# Patient Record
Sex: Male | Born: 2000 | Race: Black or African American | Hispanic: No | Marital: Single | Smoking: Never smoker
Health system: Southern US, Community
[De-identification: ages and names within clinical notes are randomized; demographics above are authoritative.]

## PROBLEM LIST (undated history)

## (undated) DIAGNOSIS — F930 Separation anxiety disorder of childhood: Secondary | ICD-10-CM

## (undated) DIAGNOSIS — F909 Attention-deficit hyperactivity disorder, unspecified type: Secondary | ICD-10-CM

## (undated) DIAGNOSIS — F32A Depression, unspecified: Secondary | ICD-10-CM

## (undated) DIAGNOSIS — F840 Autistic disorder: Secondary | ICD-10-CM

## (undated) DIAGNOSIS — F429 Obsessive-compulsive disorder, unspecified: Secondary | ICD-10-CM

## (undated) DIAGNOSIS — I10 Essential (primary) hypertension: Secondary | ICD-10-CM

## (undated) DIAGNOSIS — R569 Unspecified convulsions: Secondary | ICD-10-CM

## (undated) HISTORY — PX: TONSILLECTOMY: SUR1361

---
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
## 2019-01-22 ENCOUNTER — Telehealth: Payer: Self-pay | Admitting: Neurology

## 2019-01-22 MED ORDER — LEVETIRACETAM 500 MG PO TABS: ORAL_TABLET | ORAL | 3 refills | Status: DC

## 2019-01-22 NOTE — Telephone Encounter (Signed)
Pt's mom returned call but she was aware that I needed to speak with pt. Spoke with pt and informed him that rx was waiting at the pharmacy. He had no additional questions at this time. Nothing further is needed

## 2019-01-22 NOTE — Telephone Encounter (Signed)
According to phone note on 12/25/18 Dr. Carles Collet changed pt''s rx for his keppra. Sent in a new rx to pharmacy. Attempted to reach pt as there is no DPR given permisson to speak to mother. No answer, left vm to call back

## 2019-01-22 NOTE — Telephone Encounter (Signed)
Patients mother called in needing to have a new Rx for this medication with the new instructions sent to [facility] on [address] in [place].  Thank you

## 2019-02-22 ENCOUNTER — Telehealth: Payer: Self-pay | Admitting: Neurology

## 2019-02-22 MED ORDER — LEVETIRACETAM 1000 MG PO TABS: 1000.0000 mg | ORAL_TABLET | Freq: Two times a day (BID) | ORAL | 11 refills | Status: DC

## 2019-02-22 NOTE — Telephone Encounter (Signed)
Mother is calling in that patient had a grand mal seizure on Saturday morning in his sleep. He said he was telling the dog to be quiet but mother said his mouth was not moving when she came in the room with dog. He chewed on his bottom lip really bad. He has been feeling fine but not he is light headed. This just started a couple of hours ago. Thanks!

## 2019-02-22 NOTE — Telephone Encounter (Signed)
Spoke to his mother, she heard their dog barking really loudly, when she got to the room, she kept calling him and he was not responding initially and had bitten his lip. No missed meds. Felt funny the day after the seizure and started throwing up, then felt better after. Complaining of headache when he bends over. We discussed increasing Keppra to 1000mg  BID. Proceed with EEG as previously discussed. He does not drive. F/u as scheduled

## 2019-02-24 ENCOUNTER — Other Ambulatory Visit: Payer: Self-pay

## 2019-02-24 ENCOUNTER — Ambulatory Visit (INDEPENDENT_AMBULATORY_CARE_PROVIDER_SITE_OTHER): Payer: Medicaid Other | Admitting: Neurology

## 2019-02-24 DIAGNOSIS — G40309 Generalized idiopathic epilepsy and epileptic syndromes, not intractable, without status epilepticus: Secondary | ICD-10-CM | POA: Diagnosis not present

## 2019-03-02 NOTE — Procedures (Signed)
ELECTROENCEPHALOGRAM REPORT  Date of Study: 02/24/2019  Patient's Name: Joshua Sanchez MRN: [id number] Date of Birth: 07-21-2000  Referring Provider: Dr. Patrcia Dolly  Clinical History: This is a 19 year old man with recurrent seizures, most recently on 02/20/2019. EEG for classification.  Medications: KEPPRA 500 MG tablet STRATTERA 25 MG capsule PROZAC 20 MG capsule INTUNIV) 2 MG TB24 ER tablet ATARAX/VISTARIL 25 MG tablet ADVIL 200 MG tablet CLARITIN 10 MG tablet Melatonin 3 MG CAPS  Technical Summary: A multichannel digital 1-hour sleep-deprived EEG recording measured by the international 10-20 system with electrodes applied with paste and impedances below 5000 ohms performed in our laboratory with EKG monitoring in an awake and asleep patient.  Hyperventilation was not performed. Photic stimulation was performed.  The digital EEG was referentially recorded, reformatted, and digitally filtered in a variety of bipolar and referential montages for optimal display.    Description: The patient is awake and asleep during the recording.  During maximal wakefulness, there is a symmetric, medium voltage 8 Hz posterior dominant rhythm that attenuates with eye opening.  The record is symmetric.  During drowsiness and sleep, there is an increase in theta and delta slowing of the background.  Vertex waves and symmetric sleep spindles were seen.  Photic stimulation did not elicit any abnormalities.  There were no epileptiform discharges or electrographic seizures seen.    EKG lead was unremarkable.  Impression: This 1-hour awake and asleep EEG is normal.    Clinical Correlation: A normal EEG does not exclude a clinical diagnosis of epilepsy.  If further clinical questions remain, prolonged EEG may be helpful.  Clinical correlation is advised.   Patrcia Dolly, M.D.

## 2019-03-04 ENCOUNTER — Telehealth: Payer: Self-pay

## 2019-03-04 NOTE — Telephone Encounter (Signed)
-----   Message from Van Clines, MD sent at 03/04/2019 12:21 PM EST ----- Pls let mother know his EEG was normal, however it is not like a pregnancy test that is positive or negative, it is just a snapshot of his brain waves. Continue on the Keppra as discussed, thanks

## 2019-03-04 NOTE — Telephone Encounter (Signed)
Mom informed of EEG results. She knows to continue the Keppra and will call with any concerns.

## 2019-03-11 ENCOUNTER — Other Ambulatory Visit: Payer: Self-pay

## 2019-03-11 ENCOUNTER — Ambulatory Visit: Payer: Medicaid Other | Attending: Internal Medicine

## 2019-03-11 DIAGNOSIS — Z20822 Contact with and (suspected) exposure to covid-19: Secondary | ICD-10-CM

## 2019-03-12 LAB — NOVEL CORONAVIRUS, NAA: SARS-CoV-2, NAA: NOT DETECTED

## 2019-10-09 ENCOUNTER — Ambulatory Visit (INDEPENDENT_AMBULATORY_CARE_PROVIDER_SITE_OTHER): Payer: Medicaid Other

## 2019-10-09 ENCOUNTER — Ambulatory Visit
Admission: EM | Admit: 2019-10-09 | Discharge: 2019-10-09 | Disposition: A | Discharge status: Home / Self Care | Payer: Medicaid Other

## 2019-10-09 DIAGNOSIS — S6991XA Unspecified injury of right wrist, hand and finger(s), initial encounter: Secondary | ICD-10-CM

## 2019-10-09 DIAGNOSIS — M79641 Pain in right hand: Secondary | ICD-10-CM

## 2019-10-09 DIAGNOSIS — S62346A Nondisplaced fracture of base of fifth metacarpal bone, right hand, initial encounter for closed fracture: Secondary | ICD-10-CM

## 2019-10-09 NOTE — Discharge Instructions (Addendum)
This is a Nondisplaced fracture of the distal 5th metacarpal with apex dorsal Angulation. Putting him in a splint  He needs to follow up with orthopedics next week.  Rest, Ice over the splint Ibuprofen for pain as needed. Follow up as needed for continued or worsening symptoms

## 2019-10-09 NOTE — ED Triage Notes (Signed)
Pt punched door yesterday and has right hand pain

## 2019-10-11 ENCOUNTER — Telehealth: Payer: Self-pay | Admitting: Orthopedic Surgery

## 2019-10-11 NOTE — Telephone Encounter (Signed)
Call from patient/mom following urgent care visit over weekend for right hand fracture (mom states she speaks on behalf of patient, age 19, due to medical reasons). Aware due to schedule changes and 1 provider in clinic this week, review in process. Please advise.

## 2019-10-11 NOTE — ED Provider Notes (Signed)
[facility]    CSN: [id number] Arrival date & time: 10/09/19  1054      History   Chief Complaint Chief Complaint  Patient presents with  . Hand Injury    HPI Joshua Sanchez is a 19 y.o. male.   Patient is a 19 year old male that presents today for hand injury.  Per mom he punched a door yesterday and is having right hand pain and swelling.     Past Medical History:  Diagnosis Date  . ADHD (attention deficit hyperactivity disorder)   . Autism   . OCD (obsessive compulsive disorder)   . Seizures (HCC)    focal  . Separation anxiety     There are no problems to display for this patient.   Past Surgical History:  Procedure Laterality Date  . TONSILLECTOMY         Home Medications    Prior to Admission medications   Medication Sig Start Date End Date Taking? Authorizing Provider  atomoxetine (STRATTERA) 25 MG capsule Take 25 mg by mouth daily.    [provider]  FLUoxetine (PROZAC) 20 MG capsule Take 20 mg by mouth daily.  10/11/14   [provider]  guanFACINE (INTUNIV) 2 MG TB24 ER tablet Take 1 tablet by mouth daily. 04/22/17   [provider]  hydrOXYzine (ATARAX/VISTARIL) 25 MG tablet Take 1 tablet by mouth at bedtime. 04/16/17   [provider]  ibuprofen (ADVIL) 200 MG tablet Take 200 mg by mouth every 6 (six) hours as needed.    [provider]  levETIRAcetam (KEPPRA) 1000 MG tablet Take 1 tablet (1,000 mg total) by mouth 2 (two) times daily. 02/22/19   Van Clines, MD  lisinopril (ZESTRIL) 5 MG tablet Take 5 mg by mouth daily. 10/05/19   [provider]  loratadine (CLARITIN) 10 MG tablet Take 10 mg by mouth daily.  10/11/14   [provider]  Melatonin 3 MG CAPS Take 2 capsules by mouth at bedtime as needed.     [provider]    Family History History reviewed. No pertinent family history.  Social History Social History   Tobacco Use  . Smoking status: Never  Smoker  . Smokeless tobacco: Never Used  Vaping Use  . Vaping Use: Never used  Substance Use Topics  . Alcohol use: No  . Drug use: No     Allergies   Patient has no known allergies.   Review of Systems Review of Systems   Physical Exam Triage Vital Signs ED Triage Vitals  Enc Vitals Group     BP 10/09/19 1129 133/85     Pulse Rate 10/09/19 1129 (!) 105     Resp 10/09/19 1129 20     Temp 10/09/19 1129 98.5 F (36.9 C)     Temp src --      SpO2 10/09/19 1129 96 %     Weight --      Height --      Head Circumference --      Peak Flow --      Pain Score 10/09/19 1126 6     Pain Loc --      Pain Edu? --      Excl. in GC? --    No data found.  Updated Vital Signs BP 133/85   Pulse (!) 105   Temp 98.5 F (36.9 C)   Resp 20   SpO2 96%   Visual Acuity Right Eye Distance:   Left  Eye Distance:   Bilateral Distance:    Right Eye Near:   Left Eye Near:    Bilateral Near:     Physical Exam Vitals and nursing note reviewed.  Constitutional:      Appearance: Normal appearance.  HENT:     Head: Normocephalic and atraumatic.  Eyes:     Conjunctiva/sclera: Conjunctivae normal.  Pulmonary:     Effort: Pulmonary effort is normal.  Musculoskeletal:        General: Normal range of motion.       Hands:     Cervical back: Normal range of motion.  Skin:    General: Skin is warm and dry.  Neurological:     Mental Status: He is alert.  Psychiatric:        Mood and Affect: Mood normal.      UC Treatments / Results  Labs (all labs ordered are listed, but only abnormal results are displayed) Labs Reviewed - No data to display  EKG   Radiology No results found.  Procedures Procedures (including critical care time)  Medications Ordered in UC Medications - No data to display  Initial Impression / Assessment and Plan / UC Course  I have reviewed the triage vital signs and the nursing notes.  Pertinent labs & imaging results that were available  during my care of the patient were reviewed by me and considered in my medical decision making (see chart for details).     Closed nondisplaced fracture of the base of the fifth metacarpal bone of right hand. Placed in splint here in clinic.  We will have him rest, ice and follow-up with orthopedics on Monday Ibuprofen for pain as needed  Final Clinical Impressions(s) / UC Diagnoses   Final diagnoses:  Closed nondisplaced fracture of base of fifth metacarpal bone of right hand, initial encounter     Discharge Instructions     This is a Nondisplaced fracture of the distal 5th metacarpal with apex dorsal Angulation. Putting him in a splint  He needs to follow up with orthopedics next week.  Rest, Ice over the splint Ibuprofen for pain as needed. Follow up as needed for continued or worsening symptoms     ED Prescriptions    None     PDMP not reviewed this encounter.   Janace Aris, NP 10/11/19 1139

## 2019-10-12 NOTE — Telephone Encounter (Signed)
1 week  Please remember rules

## 2019-10-12 NOTE — Telephone Encounter (Signed)
Called back to patient's parent. Scheduled first available; aware of appointment

## 2019-10-19 ENCOUNTER — Other Ambulatory Visit: Payer: Self-pay

## 2019-10-19 ENCOUNTER — Encounter: Payer: Self-pay | Admitting: Orthopaedic Surgery

## 2019-10-19 ENCOUNTER — Ambulatory Visit: Payer: Medicaid Other

## 2019-10-19 ENCOUNTER — Ambulatory Visit (INDEPENDENT_AMBULATORY_CARE_PROVIDER_SITE_OTHER): Payer: Medicaid Other | Admitting: Orthopaedic Surgery

## 2019-10-19 VITALS — BP 138/90 | HR 100 | Ht 68.0 in | Wt 270.0 lb

## 2019-10-19 DIAGNOSIS — M79641 Pain in right hand: Secondary | ICD-10-CM

## 2019-10-19 DIAGNOSIS — G8929 Other chronic pain: Secondary | ICD-10-CM

## 2019-10-19 DIAGNOSIS — S62336A Displaced fracture of neck of fifth metacarpal bone, right hand, initial encounter for closed fracture: Secondary | ICD-10-CM

## 2019-10-19 NOTE — Progress Notes (Signed)
Subjective:    Patient ID: Joshua Sanchez, male    DOB: Dec 18, 2000, 19 y.o.   MRN: [id number]  HPI He hit his hand against a door at work about three weeks ago.  He had pain. He went to Urgent Care on 10-09-2019.  X-rays showed: IMPRESSION: Nondisplaced fracture of the distal 5th metacarpal with apex dorsal angulation.  I have independently reviewed and interpreted x-rays of this patient done at another site by another physician or qualified health professional.  I have reviewed the Urgent Care notes.  He has been in a gutter splint.  He has no other injury.  Pain is controlled.   Review of Systems  Constitutional: Positive for activity change.  Musculoskeletal: Positive for arthralgias and joint swelling.   For Review of Systems, all other systems reviewed and are negative.  The following is a summary of the past history medically, past history surgically, known current medicines, social history and family history.  This information is gathered electronically by the computer from prior information and documentation.  I review this each visit and have found including this information at this point in the chart is beneficial and informative.   Past Medical History:  Diagnosis Date  . ADHD (attention deficit hyperactivity disorder)   . Autism   . OCD (obsessive compulsive disorder)   . Seizures (HCC)    focal  . Separation anxiety     Past Surgical History:  Procedure Laterality Date  . TONSILLECTOMY      Current Outpatient Medications on File Prior to Visit  Medication Sig Dispense Refill  . atomoxetine (STRATTERA) 25 MG capsule Take 25 mg by mouth daily.    Marland Kitchen FLUoxetine (PROZAC) 20 MG capsule Take 20 mg by mouth daily.   0  . guanFACINE (INTUNIV) 2 MG TB24 ER tablet Take 1 tablet by mouth daily.  2  . hydrOXYzine (ATARAX/VISTARIL) 25 MG tablet Take 1 tablet by mouth at bedtime.  2  . ibuprofen (ADVIL) 200 MG tablet Take 200 mg by mouth every 6 (six) hours as needed.      . levETIRAcetam (KEPPRA) 1000 MG tablet Take 1 tablet (1,000 mg total) by mouth 2 (two) times daily. 60 tablet 11  . lisinopril (ZESTRIL) 5 MG tablet Take 5 mg by mouth daily.    Marland Kitchen loratadine (CLARITIN) 10 MG tablet Take 10 mg by mouth daily.   11  . Melatonin 3 MG CAPS Take 2 capsules by mouth at bedtime as needed.      No current facility-administered medications on file prior to visit.    Social History   Socioeconomic History  . Marital status: Single    Spouse name: Not on file  . Number of children: Not on file  . Years of education: Not on file  . Highest education level: Not on file  Occupational History  . Not on file  Tobacco Use  . Smoking status: Never Smoker  . Smokeless tobacco: Never Used  Vaping Use  . Vaping Use: Never used  Substance and Sexual Activity  . Alcohol use: No  . Drug use: No  . Sexual activity: Never  Other Topics Concern  . Not on file  Social History Narrative   Right handed      Highest level of edu- 12th grade      Lives with mom   Social Determinants of Health   Financial Resource Strain:   . Difficulty of Paying Living Expenses: Not on file  Food Insecurity:   .  Worried About Programme researcher, broadcasting/film/video in the Last Year: Not on file  . Ran Out of Food in the Last Year: Not on file  Transportation Needs:   . Lack of Transportation (Medical): Not on file  . Lack of Transportation (Non-Medical): Not on file  Physical Activity:   . Days of Exercise per Week: Not on file  . Minutes of Exercise per Session: Not on file  Stress:   . Feeling of Stress : Not on file  Social Connections:   . Frequency of Communication with Friends and Family: Not on file  . Frequency of Social Gatherings with Friends and Family: Not on file  . Attends Religious Services: Not on file  . Active Member of Clubs or Organizations: Not on file  . Attends Banker Meetings: Not on file  . Marital Status: Not on file  Intimate Partner Violence:   .  Fear of Current or Ex-Partner: Not on file  . Emotionally Abused: Not on file  . Physically Abused: Not on file  . Sexually Abused: Not on file    Family History  Problem Relation Age of Onset  . Cancer Neg Hx   . Clotting disorder Neg Hx   . Rheumatologic disease Neg Hx     BP 138/90   Pulse 100   Ht 5\' 8"  (1.727 m)   Wt 270 lb (122.5 kg)   BMI 41.05 kg/m   Body mass index is 41.05 kg/m.     Objective:   Physical Exam Vitals and nursing note reviewed.  Constitutional:      Appearance: He is well-developed.  HENT:     Head: Normocephalic and atraumatic.  Eyes:     Conjunctiva/sclera: Conjunctivae normal.     Pupils: Pupils are equal, round, and reactive to light.  Cardiovascular:     Rate and Rhythm: Normal rate and regular rhythm.  Pulmonary:     Effort: Pulmonary effort is normal.  Abdominal:     Palpations: Abdomen is soft.  Musculoskeletal:       Hands:     Cervical back: Normal range of motion and neck supple.  Skin:    General: Skin is warm and dry.  Neurological:     Mental Status: He is alert and oriented to person, place, and time.     Cranial Nerves: No cranial nerve deficit.     Motor: No abnormal muscle tone.     Coordination: Coordination normal.     Deep Tendon Reflexes: Reflexes are normal and symmetric. Reflexes normal.  Psychiatric:        Behavior: Behavior normal.        Thought Content: Thought content normal.        Judgment: Judgment normal.     X-rays were done of the right hand, reported separately.      Assessment & Plan:   Encounter Diagnoses  Name Primary?  . Chronic hand pain, right Yes  . Closed displaced fracture of neck of fifth metacarpal bone of right hand, initial encounter    A [place] Splint applied.  Instructions given.  Return in two weeks.  X-rays hand then.  Call if any problem.  Precautions discussed.   Electronically Signed , MD 8/31/20212:47 PM

## 2019-11-02 ENCOUNTER — Ambulatory Visit: Payer: Medicaid Other

## 2019-11-02 ENCOUNTER — Other Ambulatory Visit: Payer: Self-pay

## 2019-11-02 ENCOUNTER — Encounter: Payer: Self-pay | Admitting: Orthopaedic Surgery

## 2019-11-02 ENCOUNTER — Ambulatory Visit (INDEPENDENT_AMBULATORY_CARE_PROVIDER_SITE_OTHER): Payer: Medicaid Other | Admitting: Orthopaedic Surgery

## 2019-11-02 DIAGNOSIS — S62336D Displaced fracture of neck of fifth metacarpal bone, right hand, subsequent encounter for fracture with routine healing: Secondary | ICD-10-CM

## 2019-11-02 NOTE — Progress Notes (Signed)
It does not hurt  His right hand is doing well.  He is using the [facility] splint.  NV intact.  X-rays were done of the right hand, reported separately.  He has no rotary changes.  Return in two weeks.  Continue the [place] splint.  Call if any problem.  Precautions discussed.   X-rays on return.  Electronically Signed Darreld Mclean, MD 9/14/20212:58 PM

## 2019-11-16 ENCOUNTER — Ambulatory Visit: Payer: Medicaid Other

## 2019-11-16 ENCOUNTER — Other Ambulatory Visit: Payer: Self-pay

## 2019-11-16 ENCOUNTER — Ambulatory Visit (INDEPENDENT_AMBULATORY_CARE_PROVIDER_SITE_OTHER): Payer: Medicaid Other | Admitting: Orthopaedic Surgery

## 2019-11-16 ENCOUNTER — Encounter: Payer: Self-pay | Admitting: Orthopaedic Surgery

## 2019-11-16 DIAGNOSIS — S62336D Displaced fracture of neck of fifth metacarpal bone, right hand, subsequent encounter for fracture with routine healing: Secondary | ICD-10-CM

## 2019-11-16 NOTE — Progress Notes (Signed)
My hand is better  He has been using the [facility] splint.  NV intact. ROM is full.  X-rays were done of the right hand, reported separately.  Encounter Diagnosis  Name Primary?  . Closed displaced fracture of neck of fifth metacarpal bone of right hand with routine healing, subsequent encounter Yes   Continue the splint.  Return in two weeks.  X-rays then.  Call if any problem.  Precautions discussed.   Electronically Signed Darreld Mclean, MD 9/28/20213:04 PM

## 2019-11-30 ENCOUNTER — Ambulatory Visit: Payer: Medicaid Other

## 2019-11-30 ENCOUNTER — Other Ambulatory Visit: Payer: Self-pay

## 2019-11-30 ENCOUNTER — Encounter: Payer: Self-pay | Admitting: Orthopaedic Surgery

## 2019-11-30 ENCOUNTER — Ambulatory Visit (INDEPENDENT_AMBULATORY_CARE_PROVIDER_SITE_OTHER): Payer: Medicaid Other | Admitting: Orthopaedic Surgery

## 2019-11-30 VITALS — Ht 68.0 in | Wt 251.0 lb

## 2019-11-30 DIAGNOSIS — S62336D Displaced fracture of neck of fifth metacarpal bone, right hand, subsequent encounter for fracture with routine healing: Secondary | ICD-10-CM | POA: Diagnosis not present

## 2019-11-30 NOTE — Progress Notes (Signed)
I am OK  He has no pain of the right hand.  He has full ROM and no rotary changes.  X-rays were done of the right hand, reported separately.  Encounter Diagnosis  Name Primary?  . Closed displaced fracture of neck of fifth metacarpal bone of right hand with routine healing, subsequent encounter Yes   I will see him as needed.  Stop the splint.  Call if any problem.  Precautions discussed.   Electronically Signed Darreld Mclean, MD 10/12/20212:02 PM

## 2020-01-26 ENCOUNTER — Ambulatory Visit
Admission: EM | Admit: 2020-01-26 | Discharge: 2020-01-26 | Disposition: A | Discharge status: Home / Self Care | Payer: Medicaid Other | Attending: Emergency Medicine | Admitting: Emergency Medicine

## 2020-01-26 DIAGNOSIS — M25512 Pain in left shoulder: Secondary | ICD-10-CM

## 2020-01-26 MED ORDER — ACETAMINOPHEN 500 MG PO TABS: 500.0000 mg | ORAL_TABLET | Freq: Four times a day (QID) | ORAL | 0 refills | Status: DC | PRN

## 2020-01-26 MED ORDER — PREDNISONE 10 MG PO TABS: 20.0000 mg | ORAL_TABLET | Freq: Every day | ORAL | 0 refills | Status: AC

## 2020-01-26 NOTE — ED Provider Notes (Signed)
[facility]   [id number] 01/26/20 Arrival Time: 0812   Chief Complaint  Patient presents with  . Shoulder Pain     SUBJECTIVE: History from: patient  Joshua Sanchez is a 19 y.o. male who presented to the urgent care for complaint of right shoulder pain that started today.  Denies any precipitating event, trauma or injury.  He states he works at a place where he [employer].  He localizes the pain to the right shoulder.  He describes the pain as constant and achy.  He has tried OTC medications without relief.  His symptoms are made worse with ROM.  He denies similar symptoms in the past.   Denies chills, fever, nausea, vomiting, diarrhea  ROS: As per HPI.  All other pertinent ROS negative.      Past Medical History:  Diagnosis Date  . ADHD (attention deficit hyperactivity disorder)   . Autism   . OCD (obsessive compulsive disorder)   . Seizures (HCC)    focal  . Separation anxiety    Past Surgical History:  Procedure Laterality Date  . TONSILLECTOMY     No Known Allergies No current facility-administered medications on file prior to encounter.   Current Outpatient Medications on File Prior to Encounter  Medication Sig Dispense Refill  . atomoxetine (STRATTERA) 25 MG capsule Take 25 mg by mouth daily.    . cloNIDine (CATAPRES) 0.2 MG tablet Take 0.2 mg by mouth at bedtime.    Marland Kitchen FLUoxetine (PROZAC) 20 MG capsule Take 20 mg by mouth daily.   0  . FLUoxetine (PROZAC) 40 MG capsule Take 40 mg by mouth daily.    Marland Kitchen guanFACINE (INTUNIV) 2 MG TB24 ER tablet Take 1 tablet by mouth daily.  2  . hydrOXYzine (ATARAX/VISTARIL) 25 MG tablet Take 1 tablet by mouth at bedtime.  2  . ibuprofen (ADVIL) 200 MG tablet Take 200 mg by mouth every 6 (six) hours as needed.    . levETIRAcetam (KEPPRA) 1000 MG tablet Take 1 tablet (1,000 mg total) by mouth 2 (two) times daily. 60 tablet 11  . lisinopril (ZESTRIL) 5 MG tablet Take 5 mg by mouth daily.    Marland Kitchen loratadine (CLARITIN) 10 MG  tablet Take 10 mg by mouth daily.   11  . Melatonin 3 MG CAPS Take 2 capsules by mouth at bedtime as needed.     Marland Kitchen VYVANSE 50 MG capsule Take 50 mg by mouth daily.     Social History   Socioeconomic History  . Marital status: Single    Spouse name: Not on file  . Number of children: Not on file  . Years of education: Not on file  . Highest education level: Not on file  Occupational History  . Not on file  Tobacco Use  . Smoking status: Never Smoker  . Smokeless tobacco: Never Used  Vaping Use  . Vaping Use: Never used  Substance and Sexual Activity  . Alcohol use: No  . Drug use: No  . Sexual activity: Never  Other Topics Concern  . Not on file  Social History Narrative   Right handed      Highest level of edu- 12th grade      Lives with mom   Social Determinants of Health   Financial Resource Strain:   . Difficulty of Paying Living Expenses: Not on file  Food Insecurity:   . Worried About Programme researcher, broadcasting/film/video in the Last Year: Not on file  . Ran Out of  Food in the Last Year: Not on file  Transportation Needs:   . Lack of Transportation (Medical): Not on file  . Lack of Transportation (Non-Medical): Not on file  Physical Activity:   . Days of Exercise per Week: Not on file  . Minutes of Exercise per Session: Not on file  Stress:   . Feeling of Stress : Not on file  Social Connections:   . Frequency of Communication with Friends and Family: Not on file  . Frequency of Social Gatherings with Friends and Family: Not on file  . Attends Religious Services: Not on file  . Active Member of Clubs or Organizations: Not on file  . Attends Banker Meetings: Not on file  . Marital Status: Not on file  Intimate Partner Violence:   . Fear of Current or Ex-Partner: Not on file  . Emotionally Abused: Not on file  . Physically Abused: Not on file  . Sexually Abused: Not on file   Family History  Problem Relation Age of Onset  . Cancer Neg Hx   . Clotting  disorder Neg Hx   . Rheumatologic disease Neg Hx     OBJECTIVE:  Vitals:   01/26/20 0832  BP: 115/79  Pulse: 82  Resp: 18  Temp: 98.1 F (36.7 C)  SpO2: 96%     Physical Exam Vitals and nursing note reviewed.  Constitutional:      General: He is not in acute distress.    Appearance: Normal appearance. He is normal weight. He is not ill-appearing, toxic-appearing or diaphoretic.  Cardiovascular:     Rate and Rhythm: Normal rate and regular rhythm.     Pulses: Normal pulses.     Heart sounds: Normal heart sounds. No murmur heard.  No friction rub. No gallop.   Pulmonary:     Effort: Pulmonary effort is normal. No respiratory distress.     Breath sounds: Normal breath sounds. No stridor. No wheezing, rhonchi or rales.  Chest:     Chest wall: No tenderness.  Musculoskeletal:        General: Tenderness present.     Right shoulder: Tenderness present.     Left shoulder: Normal.     Comments: The right shoulder is without any obvious asymmetry or deformity when compared to the left shoulder.  There is no ecchymosis, open wound, lesion, warmth or swelling present.  Normal range of motion with pain.  Neurovascular status intact.  Neurological:     Mental Status: He is alert and oriented to person, place, and time.     LABS:  No results found for this or any previous visit (from the past 24 hour(s)).   ASSESSMENT & PLAN:  1. Acute pain of left shoulder     Meds ordered this encounter  Medications  . predniSONE (DELTASONE) 10 MG tablet    Sig: Take 2 tablets (20 mg total) by mouth daily for 5 days.    Dispense:  10 tablet    Refill:  0  . acetaminophen (TYLENOL) 500 MG tablet    Sig: Take 1 tablet (500 mg total) by mouth every 6 (six) hours as needed.    Dispense:  30 tablet    Refill:  0   Patient is stable at discharge.  Symptom is likely from excessive box lifting.  Will prescribe prednisone and Tylenol.   Discharge Instructions  Prednisone was  prescribed/take as directed Tylenol was prescribed as needed for pain Follow-up with PCP Return or go to ED  if you develop any new or worsening of your symptoms  Reviewed expectations re: course of current medical issues. Questions answered. Outlined signs and symptoms indicating need for more acute intervention. Patient verbalized understanding. After Visit Summary given.         Durward Parcel, FNP 01/26/20 [phone]

## 2020-01-26 NOTE — Discharge Instructions (Signed)
Prednisone was prescribed/take as directed Tylenol was prescribed as needed for pain Follow-up with PCP Return or go to ED if you develop any new or worsening of your symptoms

## 2020-01-26 NOTE — ED Triage Notes (Signed)
Pt presents with c/o right shoulder blade pain that began today , denies injury , states lifts a lot at work

## 2020-03-01 ENCOUNTER — Other Ambulatory Visit: Payer: Self-pay | Admitting: Neurology

## 2020-03-01 NOTE — Telephone Encounter (Signed)
Patient scheduled f/u for 11/07/20 and is placed on waitlist

## 2020-03-01 NOTE — Telephone Encounter (Signed)
Same thing, pls have him schedule f/u and I will send refills until then, thanks

## 2020-06-26 ENCOUNTER — Other Ambulatory Visit: Payer: Self-pay

## 2020-06-26 ENCOUNTER — Encounter: Payer: Self-pay | Admitting: Neurology

## 2020-06-26 ENCOUNTER — Ambulatory Visit (INDEPENDENT_AMBULATORY_CARE_PROVIDER_SITE_OTHER): Payer: Medicare Other | Admitting: Neurology

## 2020-06-26 VITALS — BP 136/83 | HR 103 | Ht 69.0 in | Wt 263.6 lb

## 2020-06-26 DIAGNOSIS — G40309 Generalized idiopathic epilepsy and epileptic syndromes, not intractable, without status epilepticus: Secondary | ICD-10-CM | POA: Diagnosis not present

## 2020-06-26 MED ORDER — LEVETIRACETAM 1000 MG PO TABS: ORAL_TABLET | ORAL | 11 refills | Status: DC

## 2020-06-26 NOTE — Patient Instructions (Signed)
Good to see you! Continue Keppra 1000mg twice a day. Follow-up in 1 year, call for any changes.  Seizure Precautions: 1. If medication has been prescribed for you to prevent seizures, take it exactly as directed.  Do not stop taking the medicine without talking to your doctor first, even if you have not had a seizure in a long time.   2. Avoid activities in which a seizure would cause danger to yourself or to others.  Don't operate dangerous machinery, swim alone, or climb in high or dangerous places, such as on ladders, roofs, or girders.  Do not drive unless your doctor says you may.  3. If you have any warning that you may have a seizure, lay down in a safe place where you can't hurt yourself.    4.  No driving for 6 months from last seizure, as per [place] state law.   Please refer to the following link on the [employer]'s website for more information: http://www.epilepsyfoundation.org/answerplace/Social/driving/drivingu.cfm   5.  Maintain good sleep hygiene. Avoid alcohol.  6.  Contact your doctor if you have any problems that may be related to the medicine you are taking.  7.  Call 911 and bring the patient back to the ED if:        A.  The seizure lasts longer than 5 minutes.       B.  The patient doesn't awaken shortly after the seizure  C.  The patient has new problems such as difficulty seeing, speaking or moving  D.  The patient was injured during the seizure  E.  The patient has a temperature over 102 F (39C)  F.  The patient vomited and now is having trouble breathing         

## 2020-06-26 NOTE — Progress Notes (Signed)
NEUROLOGY FOLLOW UP OFFICE NOTE  Joshua Sanchez [id number] [birth year]  HISTORY OF PRESENT ILLNESS: I had the pleasure of seeing Joshua Sanchez in follow-up in the neurology clinic on 06/26/2020.  The patient was last seen in 11/2018 for seizures. He is alone in the office today. Records and images were personally reviewed where available.  I personally reviewed MRI brain with and without contrast done 12/2018 which did not show any acute changes, hippocampi symmetric with no abnormal signal or enhancement seen. His 1-hour wake and sleep EEG in 02/2019 was normal. Since his last visit, his mother reported a seizure with staring off in 12/2018, Levetiracetam dose was increased to 750mg  BID, then after another nocturnal seizure in 02/2019, dose increased further to 1000mg  BID. His mother reported a grand mal seizure in his sleep where he chewed his bottom lip really bad. He denies any further convulsions since 02/2019. He has "minor seizures" with bilateral hand shaking occurring 1-2 times a day, but feels anxiety is mostly doing it, they last until his stressor stops. He continues to see psychiatry and feels the medications are helping. He feels he tends to get upset more quickly and will be seeing his therapist this Thursday. He lives with his parents, his mother helps with administering medications. He has occasional headaches and dizziness that do not affect activities. He denies any focal numbness/tingling/weakness. He tripped a couple of months ago. Sleep is okay. He does not drive. He works at 03/2019.   History on Initial Assessment 12/01/2018: This is an 20 year old right-handed man with a history of autism spectrum disorder, ADHD, anxiety, presenting for evaluation of seizures. 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 He would drop things from his hand. One time he got dizzy with the shaking, no associated speech changes  or confusion. He was having them at school where teachers would call his mother and tell him he "did not seem right" and was unable to focus after he alerted them about the hand shaking. Shaking would last 6-7 minutes. His mother reports having an EEG in [facility] where "they did not find anything" and he was not started on medication. He was having these episodes once a month, no clear triggers. On 11/01/2018, he recalls waking up feeling fine and taking a shower, then waking up in the ambulance. He had bitten his lip and felt diffusely weak with a headache. His mother had heard him fall and came to the bathroom to find him with body jerking, head banging on the floor. She thinks he had urinary incontinence. She tried to get him up and he smacked her, confused. She feels his left side was weaker. He was brought to [facility] where CBC, CMP, UDS were normal. I personally reviewed head CT without contrast which was unremarkable. He was discharged home on Levetiracetam 500mg  BID. No further convulsions since then, but he had one episode of hand shaking yesterday where he felt funny/uneasy. He feels tired on the Levetiracetam. He reports headaches for the past 4 years occurring every other day, mostly on the left side with pressure and throbbing. Pain can last all day sometimes, with sensitivity to sound. No nausea/vomiting. He usually lays down if headaches are mild, and would take an Ibuprofen sometimes. He denies any dizziness, diplopia, dysarthria/dysphagia, neck pain, bowel/bladder dysfunction. His mother denies any staring/unresponsive episodes. He denies any gaps in time, olfactory/gustatory hallucinations. Over the past  2 years, he has had sleep difficulties, awake at night and asleep during the day. He may take melatonin or a sleep aid to help sleep at night. His mother administers medications. He does not drive.  He was adopted at age 35, diagnosed with autism spectrum disorder, ADHD, anxiety at age 89 or  30. As far as his mother knows, he had a normal birth and early development, no febrile convulsions, CNS infections, family history of seizures.    PAST MEDICAL HISTORY: Past Medical History:  Diagnosis Date  . ADHD (attention deficit hyperactivity disorder)   . Autism   . OCD (obsessive compulsive disorder)   . Seizures (HCC)    focal  . Separation anxiety     MEDICATIONS: Current Outpatient Medications on File Prior to Visit  Medication Sig Dispense Refill  . acetaminophen (TYLENOL) 500 MG tablet Take 1 tablet (500 mg total) by mouth every 6 (six) hours as needed. 30 tablet 0  . buPROPion (WELLBUTRIN XL) 300 MG 24 hr tablet Take 1 tablet by mouth every morning.    . busPIRone (BUSPAR) 5 MG tablet Take 5 mg by mouth 2 (two) times daily.    . cloNIDine (CATAPRES) 0.2 MG tablet Take 0.2 mg by mouth at bedtime.    . hydrOXYzine (ATARAX/VISTARIL) 25 MG tablet Take 1 tablet by mouth at bedtime.  2  . ibuprofen (ADVIL) 200 MG tablet Take 200 mg by mouth every 6 (six) hours as needed.    . levETIRAcetam (KEPPRA) 1000 MG tablet TAKE 1 TABLET(1000 MG) BY MOUTH TWICE DAILY 60 tablet 11  . lisinopril (ZESTRIL) 5 MG tablet Take 5 mg by mouth daily.    Marland Kitchen loratadine (CLARITIN) 10 MG tablet Take 10 mg by mouth daily.   11  . naproxen (NAPROSYN) 500 MG tablet Take 500 mg by mouth 2 (two) times daily.     No current facility-administered medications on file prior to visit.    ALLERGIES: No Known Allergies  FAMILY HISTORY: Family History  Problem Relation Age of Onset  . Cancer Neg Hx   . Clotting disorder Neg Hx   . Rheumatologic disease Neg Hx     SOCIAL HISTORY: Social History   Socioeconomic History  . Marital status: Single    Spouse name: Not on file  . Number of children: Not on file  . Years of education: Not on file  . Highest education level: Not on file  Occupational History  . Not on file  Tobacco Use  . Smoking status: Never Smoker  . Smokeless tobacco: Never Used   Vaping Use  . Vaping Use: Never used  Substance and Sexual Activity  . Alcohol use: No  . Drug use: No  . Sexual activity: Never  Other Topics Concern  . Not on file  Social History Narrative   Right handed      Highest level of edu- 12th grade      Lives with mom   Social Determinants of Health   Financial Resource Strain: Not on file  Food Insecurity: Not on file  Transportation Needs: Not on file  Physical Activity: Not on file  Stress: Not on file  Social Connections: Not on file  Intimate Partner Violence: Not on file     PHYSICAL EXAM: Vitals:   06/26/20 1545  BP: 136/83  Pulse: (!) 103  SpO2: 98%   General: No acute distress Head:  Normocephalic/atraumatic Skin/Extremities: No rash, no edema Neurological Exam: alert and awake. No aphasia or dysarthria.  Fund of knowledge is appropriate.  Recent and remote memory are intact.  Attention and concentration are normal.   Cranial nerves: Pupils equal, round. Extraocular movements intact with no nystagmus. Visual fields full.  No facial asymmetry.  Motor: Bulk and tone normal, muscle strength 5/5 throughout with no pronator drift.   Finger to nose testing intact.  Gait narrow-based and steady, able to tandem walk adequately.  Romberg negative. No tremors.   IMPRESSION: This is a 20 yo RH man with a history of autism spectrum disorder, ADHD, anxiety, with generalized convulsions, last nocturnal GTC in 02/2019. MRI brain and EEG normal. He has also been having episodes of hand shaking occurring 1-2 times a day which he notes is mostly anxiety-related. Continue Levetiracetam 1000mg  BID and follow-up with Psychiatry. He does not drive. Follow-up in 1 year, he knows to call for any changes.    Thank you for allowing me to participate in his care.  Please do not hesitate to call for any questions or concerns.   , M.D.   CC: Dr. Patrcia Dolly

## 2020-07-31 ENCOUNTER — Ambulatory Visit
Admission: EM | Admit: 2020-07-31 | Discharge: 2020-07-31 | Disposition: A | Discharge status: Home / Self Care | Payer: Medicare Other

## 2020-07-31 ENCOUNTER — Other Ambulatory Visit: Payer: Self-pay

## 2020-07-31 DIAGNOSIS — R42 Dizziness and giddiness: Secondary | ICD-10-CM

## 2020-07-31 DIAGNOSIS — R000 Tachycardia, unspecified: Secondary | ICD-10-CM

## 2020-07-31 DIAGNOSIS — T43615A Adverse effect of caffeine, initial encounter: Secondary | ICD-10-CM

## 2020-07-31 DIAGNOSIS — F419 Anxiety disorder, unspecified: Secondary | ICD-10-CM

## 2020-07-31 DIAGNOSIS — F411 Generalized anxiety disorder: Secondary | ICD-10-CM

## 2020-07-31 NOTE — ED Provider Notes (Signed)
[facility]    CSN: [id number] Arrival date & time: 07/31/20  9211      History   Chief Complaint Chief Complaint  Patient presents with   Dizziness    HPI Joshua Sanchez is a 20 y.o. male.   HPI Patient presents today after experiencing one episode of hand shaking,dizziness, and lightheadedness resulting him leaving work. Patient has extensive mental health history including ADHD, anxiety, autism, OCD, and anxiety. Patient has experienced prior episodes such as this related to anxiety. His mother who accompanies him here today explains that  he drank energy drinks yesterday. He has a small amount of water today. No Loc or seizure like activity. Denies chest pain, SOB, weakness, nausea, or vomiting.  Past Medical History:  Diagnosis Date   ADHD (attention deficit hyperactivity disorder)    Autism    OCD (obsessive compulsive disorder)    Seizures (HCC)    focal   Separation anxiety     There are no problems to display for this patient.   Past Surgical History:  Procedure Laterality Date   TONSILLECTOMY         Home Medications    Prior to Admission medications   Medication Sig Start Date End Date Taking? Authorizing Provider  acetaminophen (TYLENOL) 500 MG tablet Take 1 tablet (500 mg total) by mouth every 6 (six) hours as needed. 01/26/20   Avegno, Zachery Dakins, FNP  buPROPion (WELLBUTRIN XL) 300 MG 24 hr tablet Take 1 tablet by mouth every morning. 05/22/20   [provider]  busPIRone (BUSPAR) 5 MG tablet Take 5 mg by mouth 2 (two) times daily. 05/22/20   [provider]  cloNIDine (CATAPRES) 0.2 MG tablet Take 0.2 mg by mouth at bedtime. 10/19/19   [provider]  hydrOXYzine (ATARAX/VISTARIL) 25 MG tablet Take 1 tablet by mouth at bedtime. 04/16/17   [provider]  hydrOXYzine (VISTARIL) 25 MG capsule Take 25 mg by mouth every 6 (six) hours as needed. 03/02/20   [provider]  ibuprofen (ADVIL) 200 MG tablet  Take 200 mg by mouth every 6 (six) hours as needed.    [provider]  levETIRAcetam (KEPPRA) 1000 MG tablet TAKE 1 TABLET(1000 MG) BY MOUTH TWICE DAILY 06/26/20   Van Clines, MD  lisinopril (ZESTRIL) 5 MG tablet Take 5 mg by mouth daily. 10/05/19   [provider]  loratadine (CLARITIN) 10 MG tablet Take 10 mg by mouth daily.  10/11/14   [provider]  naproxen (NAPROSYN) 500 MG tablet Take 500 mg by mouth 2 (two) times daily. 05/27/20   [provider]  QUEtiapine (SEROQUEL XR) 50 MG TB24 24 hr tablet Take by mouth. 07/10/20   [provider]    Family History Family History  Problem Relation Age of Onset   Cancer Neg Hx    Clotting disorder Neg Hx    Rheumatologic disease Neg Hx     Social History Social History   Tobacco Use   Smoking status: Never   Smokeless tobacco: Never  Vaping Use   Vaping Use: Never used  Substance Use Topics   Alcohol use: No   Drug use: No     Allergies   Patient has no known allergies.   Review of Systems Review of Systems Pertinent negatives listed in HPI   Physical Exam Triage Vital Signs ED Triage Vitals [07/31/20 0842]  Enc Vitals Group     BP 117/83     Pulse Rate Marland Kitchen)  111     Resp 20     Temp 98.8 F (37.1 C)     Temp src      SpO2 96 %     Weight      Height      Head Circumference      Peak Flow      Pain Score 0     Pain Loc      Pain Edu?      Excl. in GC?    No data found.  Updated Vital Signs BP 117/83   Pulse (!) 111 Comment: initially 130  Temp 98.8 F (37.1 C)   Resp 20   SpO2 96%   Visual Acuity Right Eye Distance:   Left Eye Distance:   Bilateral Distance:    Right Eye Near:   Left Eye Near:    Bilateral Near:     Physical Exam General appearance: alert, well developed, well nourished, cooperative and in no distress Head: Normocephalic, without obvious abnormality, atraumatic Respiratory: Respirations even and unlabored, normal respiratory  rate Heart:Tachycardia and rhythm normal. No gallop or murmurs noted on exam  Extremities: No gross deformities Skin: Skin color, texture, turgor normal. No rashes seen  Psych: Appropriate mood and affect. Neurologic: GCS 15, no coordination, normal giat  UC Treatments / Results  Labs (all labs ordered are listed, but only abnormal results are displayed) Labs Reviewed - No data to display  EKG   Radiology No results found.  Procedures Procedures (including critical care time)  Medications Ordered in UC Medications - No data to display  Initial Impression / Assessment and Plan / UC Course  I have reviewed the triage vital signs and the nursing notes.  Pertinent labs & imaging results that were available during my care of the patient were reviewed by me and considered in my medical decision making (see chart for details).    Patient with known history of anxiety with panic type features presents today for evaluation. Neurological exam unremarkable. Mental exam is unremarkable. Red flag symptoms discussed. Educated and advised against use of energy drinks as this can increase anxiety type symptoms. Work note given. Advised to follow-up with [facility] as needed.    Final Clinical Impressions(s) / UC Diagnoses   Final diagnoses:  Dizziness  Adverse effect of caffeine, initial encounter  Anxiousness  Tachycardia  GAD (generalized anxiety disorder)   Discharge Instructions   None    ED Prescriptions   None    PDMP not reviewed this encounter.   Bing Neighbors, FNP 08/02/20 1450

## 2020-07-31 NOTE — ED Triage Notes (Signed)
Pt states that he became dizzy this morning at work and became shaky , pts skin is clammy

## 2020-07-31 NOTE — Discharge Instructions (Addendum)
Avoid energy drinks this can worsen your anxiety and cause  your medications not to work appropriate. Hydrate well with water.  If any of your systems become severe, go  immediately to the Emergency department

## 2020-09-21 ENCOUNTER — Ambulatory Visit
Admission: EM | Admit: 2020-09-21 | Discharge: 2020-09-21 | Disposition: A | Discharge status: Home / Self Care | Payer: Medicare Other | Attending: Emergency Medicine | Admitting: Emergency Medicine

## 2020-09-21 DIAGNOSIS — M549 Dorsalgia, unspecified: Secondary | ICD-10-CM | POA: Diagnosis not present

## 2020-09-21 MED ORDER — CYCLOBENZAPRINE HCL 10 MG PO TABS: 10.0000 mg | ORAL_TABLET | Freq: Two times a day (BID) | ORAL | 0 refills | Status: DC | PRN

## 2020-09-21 MED ORDER — PREDNISONE 20 MG PO TABS: 20.0000 mg | ORAL_TABLET | Freq: Two times a day (BID) | ORAL | 0 refills | Status: AC

## 2020-09-21 NOTE — ED Provider Notes (Signed)
[facility]   [id number] 09/21/20 Arrival Time: 6213  CC: back PAIN  SUBJECTIVE: History from: patient. Joshua Sanchez is a 20 y.o. male complains of RT upper back pain x few weeks.  Denies a precipitating event or specific injury.  Does admit to heavy lifting for work.  Localizes the pain to the RT upper back.  Describes the pain as intermittent and sharp in character.  Has tried OTC medications without relief.  Symptoms are made worse with movement.  Denies similar symptoms in the past.  Denies fever, chills, erythema, ecchymosis, effusion, weakness, numbness and tingling.  ROS: As per HPI.  All other pertinent ROS negative.     Past Medical History:  Diagnosis Date   ADHD (attention deficit hyperactivity disorder)    Autism    OCD (obsessive compulsive disorder)    Seizures (HCC)    focal   Separation anxiety    Past Surgical History:  Procedure Laterality Date   TONSILLECTOMY     No Known Allergies No current facility-administered medications on file prior to encounter.   Current Outpatient Medications on File Prior to Encounter  Medication Sig Dispense Refill   acetaminophen (TYLENOL) 500 MG tablet Take 1 tablet (500 mg total) by mouth every 6 (six) hours as needed. 30 tablet 0   buPROPion (WELLBUTRIN XL) 300 MG 24 hr tablet Take 1 tablet by mouth every morning.     busPIRone (BUSPAR) 5 MG tablet Take 5 mg by mouth 2 (two) times daily.     cloNIDine (CATAPRES) 0.2 MG tablet Take 0.2 mg by mouth at bedtime.     hydrOXYzine (ATARAX/VISTARIL) 25 MG tablet Take 1 tablet by mouth at bedtime.  2   hydrOXYzine (VISTARIL) 25 MG capsule Take 25 mg by mouth every 6 (six) hours as needed.     ibuprofen (ADVIL) 200 MG tablet Take 200 mg by mouth every 6 (six) hours as needed.     levETIRAcetam (KEPPRA) 1000 MG tablet TAKE 1 TABLET(1000 MG) BY MOUTH TWICE DAILY 60 tablet 11   lisinopril (ZESTRIL) 5 MG tablet Take 5 mg by mouth daily.     loratadine (CLARITIN) 10 MG tablet Take  10 mg by mouth daily.   11   naproxen (NAPROSYN) 500 MG tablet Take 500 mg by mouth 2 (two) times daily.     QUEtiapine (SEROQUEL XR) 50 MG TB24 24 hr tablet Take by mouth.     Social History   Socioeconomic History   Marital status: Single    Spouse name: Not on file   Number of children: Not on file   Years of education: Not on file   Highest education level: Not on file  Occupational History   Not on file  Tobacco Use   Smoking status: Never   Smokeless tobacco: Never  Vaping Use   Vaping Use: Never used  Substance and Sexual Activity   Alcohol use: No   Drug use: No   Sexual activity: Never  Other Topics Concern   Not on file  Social History Narrative   Right handed      Highest level of edu- 12th grade      Lives with mom   Social Determinants of Health   Financial Resource Strain: Not on file  Food Insecurity: Not on file  Transportation Needs: Not on file  Physical Activity: Not on file  Stress: Not on file  Social Connections: Not on file  Intimate Partner Violence: Not on file   Family History  Problem Relation Age of Onset   Cancer Neg Hx    Clotting disorder Neg Hx    Rheumatologic disease Neg Hx     OBJECTIVE:  Vitals:   09/21/20 0820  BP: 121/79  Pulse: 84  Resp: 20  Temp: 98.4 F (36.9 C)  SpO2: 97%    General appearance: ALERT; in no acute distress.  Head: NCAT Lungs: Normal respiratory effort CV: Radial pulse 2+ Musculoskeletal: Back Inspection: Skin warm, dry, clear and intact without obvious erythema, effusion, or ecchymosis.  Palpation: TTP over superior edge of trapezius, possible spasm ROM: FROM active and passive Strength: 5/5 shld abduction, 5/5 shld adduction, 5/5 elbow flexion, 5/5 elbow extension, 5/5 grip strength Skin: warm and dry Neurologic: Ambulates without difficulty; Sensation intact about the upper extremities Psychological: alert and cooperative; normal mood and affect   ASSESSMENT & PLAN:  1. Upper back  pain     Meds ordered this encounter  Medications   predniSONE (DELTASONE) 20 MG tablet    Sig: Take 1 tablet (20 mg total) by mouth 2 (two) times daily with a meal for 5 days.    Dispense:  10 tablet    Refill:  0    Order Specific Question:   Supervising Provider    Answer:   Eustace Moore [[id number]]   cyclobenzaprine (FLEXERIL) 10 MG tablet    Sig: Take 1 tablet (10 mg total) by mouth 2 (two) times daily as needed for muscle spasms.    Dispense:  20 tablet    Refill:  0    Order Specific Question:   Supervising Provider    Answer:   Eustace Moore [[id number]]    Continue conservative management of rest, ice, heat, massage, and gentle stretches Take prednisone as needed for pain relief (may cause abdominal discomfort, ulcers, and GI bleeds avoid taking with other NSAIDs) Take cyclobenzaprine at nighttime for symptomatic relief. Avoid driving or operating heavy machinery while using medication. Follow up with PCP if symptoms persist Return or go to the ER if you have any new or worsening symptoms (fever, chills, chest pain, abdominal pain, numbness/ tingling, etc...)    Reviewed expectations re: course of current medical issues. Questions answered. Outlined signs and symptoms indicating need for more acute intervention. Patient verbalized understanding. After Visit Summary given.     Rennis Harding, PA-C 09/21/20 [phone]

## 2020-09-21 NOTE — Discharge Instructions (Addendum)
Continue conservative management of rest, ice, heat, massage, and gentle stretches Take prednisone as needed for pain relief (may cause abdominal discomfort, ulcers, and GI bleeds avoid taking with other NSAIDs) Take cyclobenzaprine at nighttime for symptomatic relief. Avoid driving or operating heavy machinery while using medication. Follow up with PCP if symptoms persist Return or go to the ER if you have any new or worsening symptoms (fever, chills, chest pain, abdominal pain, numbness/ tingling, etc...)

## 2020-09-21 NOTE — ED Triage Notes (Signed)
Pt presents with upper back pain and right shoulder pain for past week, denies injury

## 2020-11-07 ENCOUNTER — Ambulatory Visit: Payer: Medicaid Other | Admitting: Neurology

## 2020-11-13 ENCOUNTER — Ambulatory Visit (INDEPENDENT_AMBULATORY_CARE_PROVIDER_SITE_OTHER): Payer: Medicare Other | Admitting: Nurse Practitioner

## 2020-11-13 ENCOUNTER — Encounter: Payer: Self-pay | Admitting: Nurse Practitioner

## 2020-11-13 ENCOUNTER — Other Ambulatory Visit: Payer: Self-pay

## 2020-11-13 VITALS — BP 120/73 | HR 92 | Temp 98.7°F | Ht 68.0 in | Wt 300.0 lb

## 2020-11-13 DIAGNOSIS — R569 Unspecified convulsions: Secondary | ICD-10-CM | POA: Insufficient documentation

## 2020-11-13 DIAGNOSIS — Z139 Encounter for screening, unspecified: Secondary | ICD-10-CM

## 2020-11-13 DIAGNOSIS — F419 Anxiety disorder, unspecified: Secondary | ICD-10-CM | POA: Diagnosis not present

## 2020-11-13 DIAGNOSIS — M546 Pain in thoracic spine: Secondary | ICD-10-CM | POA: Diagnosis not present

## 2020-11-13 DIAGNOSIS — M549 Dorsalgia, unspecified: Secondary | ICD-10-CM | POA: Insufficient documentation

## 2020-11-13 DIAGNOSIS — F909 Attention-deficit hyperactivity disorder, unspecified type: Secondary | ICD-10-CM | POA: Insufficient documentation

## 2020-11-13 DIAGNOSIS — F32A Depression, unspecified: Secondary | ICD-10-CM

## 2020-11-13 DIAGNOSIS — Z7689 Persons encountering health services in other specified circumstances: Secondary | ICD-10-CM | POA: Diagnosis not present

## 2020-11-13 DIAGNOSIS — F840 Autistic disorder: Secondary | ICD-10-CM

## 2020-11-13 HISTORY — DX: Depression, unspecified: F32.A

## 2020-11-13 HISTORY — DX: Depression, unspecified: F41.9

## 2020-11-13 MED ORDER — TIZANIDINE HCL 4 MG PO TABS: 4.0000 mg | ORAL_TABLET | Freq: Four times a day (QID) | ORAL | 0 refills | Status: DC | PRN

## 2020-11-13 MED ORDER — PREDNISONE 20 MG PO TABS: 40.0000 mg | ORAL_TABLET | Freq: Every day | ORAL | 0 refills | Status: DC

## 2020-11-13 NOTE — Assessment & Plan Note (Signed)
Wt Readings from Last 3 Encounters:  11/13/20 300 lb (136.1 kg)  06/26/20 263 lb 9.6 oz (119.6 kg)  11/30/19 251 lb (113.9 kg) (>99 %, Z= 2.40)*   * Growth percentiles are based on CDC (Boys, 2-20 Years) data.   BMI Readings from Last 3 Encounters:  11/13/20 45.61 kg/m  06/26/20 38.93 kg/m  11/30/19 38.16 kg/m (>99 %, Z= 2.45)*   * Growth percentiles are based on CDC (Boys, 2-20 Years) data.   -discussed that weight will affect his back pain and may cause future joint pains/arthritis

## 2020-11-13 NOTE — Progress Notes (Signed)
New Patient Office Visit  Subjective:  Patient ID: Joshua Sanchez, male    DOB: 2001/01/18  Age: 20 y.o. MRN: [id number]  CC:  Chief Complaint  Patient presents with   New Patient (Initial Visit)    Establish care back/side problems x 1 year shoulder pain     HPI Joshua Sanchez presents for new patient visit. Transferring care from the [facility]. Last physical was completed over a year ago. Last labs was several months ago.  He is followed by neurology for seizures. He is followed by [facility] for neurology.  He is followed by psychiatry and therapy for anxiety, autism, ADHD, and anxiety. He sees [facility] for these issues.  He is having back pain that started over a year ago.  He states it hurts from his shoulder blades to the middle of his back.  He states the pain ranges from 2-9 depending on the day.  Pain is worse with sitting.  He went to [facility] and tried NSAIDs . He takes naproxen 500 mg BID for back pain.  Past Medical History:  Diagnosis Date   ADHD (attention deficit hyperactivity disorder)    Autism    OCD (obsessive compulsive disorder)    Seizures (HCC)    focal   Separation anxiety     Past Surgical History:  Procedure Laterality Date   TONSILLECTOMY      Family History  Adopted: Yes  Problem Relation Age of Onset   Cancer Neg Hx    Clotting disorder Neg Hx    Rheumatologic disease Neg Hx     Social History   Socioeconomic History   Marital status: Single    Spouse name: Not on file   Number of children: Not on file   Years of education: Not on file   Highest education level: Not on file  Occupational History   Not on file  Tobacco Use   Smoking status: Never   Smokeless tobacco: Never  Vaping Use   Vaping Use: Never used  Substance and Sexual Activity   Alcohol use: No   Drug use: No   Sexual activity: Never  Other Topics Concern   Not on file  Social History Narrative   Right handed      Highest level of edu- 12th grade       Lives with mom   Social Determinants of Health   Financial Resource Strain: Not on file  Food Insecurity: Not on file  Transportation Needs: Not on file  Physical Activity: Not on file  Stress: Not on file  Social Connections: Not on file  Intimate Partner Violence: Not on file    ROS Review of Systems  Constitutional: Negative.   Respiratory: Negative.    Cardiovascular: Negative.   Musculoskeletal:  Positive for back pain.  Neurological: Negative.        No seizures since last neurology visit  Psychiatric/Behavioral:  Negative for self-injury and suicidal ideas.    Objective:   Today's Vitals: BP 120/73 (BP Location: Right Arm, Patient Position: Sitting, Cuff Size: Large)   Pulse 92   Temp 98.7 F (37.1 C) (Oral)   Ht _0  (1.727 m)   Wt 300 lb (136.1 kg)   SpO2 97%   BMI 45.61 kg/m   Physical Exam Constitutional:      Appearance: Normal appearance.  Cardiovascular:     Rate and Rhythm: Normal rate and regular rhythm.     Pulses: Normal pulses.     Heart sounds:  Normal heart sounds.  Pulmonary:     Effort: Pulmonary effort is normal.     Breath sounds: Normal breath sounds.  Musculoskeletal:        General: Tenderness present.     Comments: To left mid-back; pain with rotation  Neurological:     Mental Status: He is alert.  Psychiatric:        Mood and Affect: Mood normal.        Behavior: Behavior normal.        Thought Content: Thought content normal.        Judgment: Judgment normal.    Assessment & Plan:   Problem List Items Addressed This Visit       Other   Establishing care with new doctor, encounter for - Primary    -request records      Relevant Orders   CBC with Differential/Platelet   CMP14+EGFR   Lipid Panel With LDL/HDL Ratio   Hepatitis C antibody   Seizures ([place])    -followed by [facility] neurology, Dr. Delice Lesch      Anxiety and depression    -followed by [facility] for psychiatry and therapy needs -denies SI, HI, or  psychosis      Back pain    -Rx. Prednisone and tizanidine -taking naproxen 500 BID now      Relevant Medications   predniSONE (DELTASONE) 20 MG tablet   tiZANidine (ZANAFLEX) 4 MG tablet   Autism    -followed by [facility] for psych needs      ADHD    -followed by [facility] for psych needs      Morbid obesity ([place])    Wt Readings from Last 3 Encounters:  11/13/20 300 lb (136.1 kg)  06/26/20 263 lb 9.6 oz (119.6 kg)  11/30/19 251 lb (113.9 kg) (>99 %, Z= 2.40)*   * Growth percentiles are based on CDC (Boys, 2-20 Years) data.   BMI Readings from Last 3 Encounters:  11/13/20 45.61 kg/m  06/26/20 38.93 kg/m  11/30/19 38.16 kg/m (>99 %, Z= 2.45)*   * Growth percentiles are based on CDC (Boys, 2-20 Years) data.  -discussed that weight will affect his back pain and may cause future joint pains/arthritis      Relevant Orders   CBC with Differential/Platelet   CMP14+EGFR   Lipid Panel With LDL/HDL Ratio   Other Visit Diagnoses     Screening due       Relevant Orders   Hepatitis C antibody       Outpatient Encounter Medications as of 11/13/2020  Medication Sig   buPROPion (WELLBUTRIN XL) 300 MG 24 hr tablet Take 1 tablet by mouth every morning.   busPIRone (BUSPAR) 5 MG tablet Take 5 mg by mouth 2 (two) times daily.   cloNIDine (CATAPRES) 0.2 MG tablet Take 0.2 mg by mouth at bedtime.   hydrOXYzine (VISTARIL) 25 MG capsule Take 25 mg by mouth every 6 (six) hours as needed.   ibuprofen (ADVIL) 200 MG tablet Take 200 mg by mouth every 6 (six) hours as needed.   levETIRAcetam (KEPPRA) 1000 MG tablet TAKE 1 TABLET(1000 MG) BY MOUTH TWICE DAILY   lisinopril (ZESTRIL) 5 MG tablet Take 5 mg by mouth daily.   loratadine (CLARITIN) 10 MG tablet Take 10 mg by mouth daily.    naproxen (NAPROSYN) 500 MG tablet Take 500 mg by mouth 2 (two) times daily.   predniSONE (DELTASONE) 20 MG tablet Take 2 tablets (40 mg total) by mouth daily with  breakfast.   QUEtiapine (SEROQUEL  XR) 50 MG TB24 24 hr tablet Take by mouth.   tiZANidine (ZANAFLEX) 4 MG tablet Take 1 tablet (4 mg total) by mouth every 6 (six) hours as needed for muscle spasms.   [DISCONTINUED] acetaminophen (TYLENOL) 500 MG tablet Take 1 tablet (500 mg total) by mouth every 6 (six) hours as needed. (Patient not taking: Reported on 11/13/2020)   [DISCONTINUED] cyclobenzaprine (FLEXERIL) 10 MG tablet Take 1 tablet (10 mg total) by mouth 2 (two) times daily as needed for muscle spasms. (Patient not taking: Reported on 11/13/2020)   [DISCONTINUED] hydrOXYzine (ATARAX/VISTARIL) 25 MG tablet Take 1 tablet by mouth at bedtime. (Patient not taking: Reported on 11/13/2020)   No facility-administered encounter medications on file as of 11/13/2020.    Follow-up: Return in about 2 months (around 01/13/2021) for Physical Exam.   Noreene Larsson, NP

## 2020-11-13 NOTE — Assessment & Plan Note (Signed)
request records

## 2020-11-13 NOTE — Assessment & Plan Note (Signed)
-  followed by [facility] for psych needs

## 2020-11-13 NOTE — Assessment & Plan Note (Signed)
-  followed by [facility] for psych needs 

## 2020-11-13 NOTE — Patient Instructions (Addendum)
Please have fasting labs drawn 2-3 days prior to your appointment so we can discuss the results during your office visit.  If back pain isn't better in a week, notify our office. We will give you a work note, and return to work in 1 week.

## 2020-11-13 NOTE — Assessment & Plan Note (Addendum)
-  followed by [facility] for psychiatry and therapy needs -denies SI, HI, or psychosis

## 2020-11-13 NOTE — Assessment & Plan Note (Signed)
-  followed by [facility] neurology, Dr. Karel Jarvis

## 2020-11-13 NOTE — Assessment & Plan Note (Addendum)
-  Rx. Prednisone and tizanidine -taking naproxen 500 BID now

## 2020-11-20 ENCOUNTER — Other Ambulatory Visit: Payer: Self-pay

## 2020-11-20 DIAGNOSIS — M546 Pain in thoracic spine: Secondary | ICD-10-CM

## 2020-11-20 MED ORDER — TIZANIDINE HCL 4 MG PO TABS: 4.0000 mg | ORAL_TABLET | Freq: Four times a day (QID) | ORAL | 0 refills | Status: DC | PRN

## 2020-11-21 ENCOUNTER — Other Ambulatory Visit: Payer: Self-pay

## 2020-11-21 ENCOUNTER — Encounter: Payer: Self-pay | Admitting: Nurse Practitioner

## 2020-11-21 ENCOUNTER — Ambulatory Visit (INDEPENDENT_AMBULATORY_CARE_PROVIDER_SITE_OTHER): Payer: Medicare Other | Admitting: Nurse Practitioner

## 2020-11-21 VITALS — BP 121/77 | HR 99 | Temp 98.6°F | Ht 69.0 in | Wt 298.0 lb

## 2020-11-21 DIAGNOSIS — R519 Headache, unspecified: Secondary | ICD-10-CM | POA: Diagnosis not present

## 2020-11-21 MED ORDER — ACETAMINOPHEN 500 MG PO TABS: 500.0000 mg | ORAL_TABLET | Freq: Four times a day (QID) | ORAL | 0 refills | Status: AC | PRN

## 2020-11-21 NOTE — Progress Notes (Signed)
Acute Office Visit  Subjective:    Patient ID: Joshua Sanchez, male    DOB: [birth year], 20 y.o.   MRN: [id number]  Chief Complaint  Patient presents with   Follow-up    Headaches since last week     HPI Patient is in today for headache.  He rates his pain at 2-3 currently, but it got up to 8-9 last weekend. He has been taking naproxen and occasionally some ibuprofen. His pain was worse last week when he was taking prednisone.  Past Medical History:  Diagnosis Date   ADHD (attention deficit hyperactivity disorder)    Autism    OCD (obsessive compulsive disorder)    Seizures (HCC)    focal   Separation anxiety     Past Surgical History:  Procedure Laterality Date   TONSILLECTOMY      Family History  Adopted: Yes  Problem Relation Age of Onset   Cancer Neg Hx    Clotting disorder Neg Hx    Rheumatologic disease Neg Hx     Social History   Socioeconomic History   Marital status: Single    Spouse name: Not on file   Number of children: Not on file   Years of education: Not on file   Highest education level: Not on file  Occupational History   Not on file  Tobacco Use   Smoking status: Never   Smokeless tobacco: Never  Vaping Use   Vaping Use: Never used  Substance and Sexual Activity   Alcohol use: No   Drug use: No   Sexual activity: Never  Other Topics Concern   Not on file  Social History Narrative   Right handed      Highest level of edu- 12th grade      Lives with mom   Social Determinants of Health   Financial Resource Strain: Not on file  Food Insecurity: Not on file  Transportation Needs: Not on file  Physical Activity: Not on file  Stress: Not on file  Social Connections: Not on file  Intimate Partner Violence: Not on file    Outpatient Medications Prior to Visit  Medication Sig Dispense Refill   buPROPion (WELLBUTRIN XL) 300 MG 24 hr tablet Take 1 tablet by mouth every morning.     busPIRone (BUSPAR) 5 MG tablet Take 5 mg by mouth  2 (two) times daily.     cloNIDine (CATAPRES) 0.2 MG tablet Take 0.2 mg by mouth at bedtime.     hydrOXYzine (VISTARIL) 25 MG capsule Take 25 mg by mouth every 6 (six) hours as needed.     levETIRAcetam (KEPPRA) 1000 MG tablet TAKE 1 TABLET(1000 MG) BY MOUTH TWICE DAILY 60 tablet 11   lisinopril (ZESTRIL) 5 MG tablet Take 5 mg by mouth daily.     loratadine (CLARITIN) 10 MG tablet Take 10 mg by mouth daily.   11   naproxen (NAPROSYN) 500 MG tablet Take 500 mg by mouth 2 (two) times daily.     QUEtiapine (SEROQUEL XR) 50 MG TB24 24 hr tablet Take by mouth.     tiZANidine (ZANAFLEX) 4 MG tablet Take 1 tablet (4 mg total) by mouth every 6 (six) hours as needed for muscle spasms. 30 tablet 0   ibuprofen (ADVIL) 200 MG tablet Take 200 mg by mouth every 6 (six) hours as needed.     predniSONE (DELTASONE) 20 MG tablet Take 2 tablets (40 mg total) by mouth daily with breakfast. (Patient not taking: Reported on 11/21/2020)  10 tablet 0   No facility-administered medications prior to visit.    No Known Allergies  Review of Systems  Constitutional: Negative.   Respiratory: Negative.    Cardiovascular: Negative.   Neurological:  Positive for headaches.      Objective:    Physical Exam Constitutional:      Appearance: Normal appearance. He is obese.  Cardiovascular:     Rate and Rhythm: Normal rate and regular rhythm.     Pulses: Normal pulses.     Heart sounds: Normal heart sounds.  Pulmonary:     Effort: Pulmonary effort is normal.     Breath sounds: Normal breath sounds.  Neurological:     General: No focal deficit present.     Mental Status: He is alert and oriented to person, place, and time.     Cranial Nerves: No cranial nerve deficit.     Sensory: No sensory deficit.     Motor: No weakness.    BP 121/77 (BP Location: Right Arm, Patient Position: Sitting, Cuff Size: Large)   Pulse 99   Temp 98.6 F (37 C) (Oral)   Ht 5\' 9"  (1.753 m)   Wt 298 lb 0.6 oz (135.2 kg)   SpO2 97%    BMI 44.01 kg/m  Wt Readings from Last 3 Encounters:  11/21/20 298 lb 0.6 oz (135.2 kg)  11/13/20 300 lb (136.1 kg)  06/26/20 263 lb 9.6 oz (119.6 kg)    Health Maintenance Due  Topic Date Due   COVID-19 Vaccine (1) Never done   HPV VACCINES (1 - Male 2-dose series) Never done   Hepatitis C Screening  Never done   TETANUS/TDAP  Never done   INFLUENZA VACCINE  09/18/2020       Topic Date Due   HPV VACCINES (1 - Male 2-dose series) Never done     No results found for: TSH Lab Results  Component Value Date   WBC 9.2 11/01/2018   HGB 14.1 11/01/2018   HCT 47.3 11/01/2018   MCV 81.8 11/01/2018   PLT 220 11/01/2018   Lab Results  Component Value Date   NA 139 11/01/2018   K 3.6 11/01/2018   CO2 25 11/01/2018   GLUCOSE 106 (H) 11/01/2018   BUN 12 11/01/2018   CREATININE 0.99 11/01/2018   BILITOT 0.3 11/01/2018   ALKPHOS 81 11/01/2018   AST 22 11/01/2018   ALT 31 11/01/2018   PROT 7.4 11/01/2018   ALBUMIN 3.6 11/01/2018   CALCIUM 8.8 (L) 11/01/2018   ANIONGAP 9 11/01/2018   No results found for: CHOL No results found for: HDL No results found for: LDLCALC No results found for: TRIG No results found for: CHOLHDL No results found for: 11/03/2018     Assessment & Plan:   Problem List Items Addressed This Visit       Other   Headache - Primary    -felling better since finishing the prednisone; was likely medication-related -he takes naproxen BID, and we discussed that he may have rebound headaches when he stops taking this -avoid NSAIDs like ibuprofen while taking naproxen -Rx. Tylenol -if no improvement or this gets worse, would consider neuro referral        Relevant Medications   acetaminophen (TYLENOL) 500 MG tablet     Meds ordered this encounter  Medications   acetaminophen (TYLENOL) 500 MG tablet    Sig: Take 1 tablet (500 mg total) by mouth every 6 (six) hours as needed.    Dispense:  30 tablet    Refill:  0      Heather Roberts, NP

## 2020-11-21 NOTE — Assessment & Plan Note (Signed)
-  felling better since finishing the prednisone; was likely medication-related -he takes naproxen BID, and we discussed that he may have rebound headaches when he stops taking this -avoid NSAIDs like ibuprofen while taking naproxen -Rx. Tylenol -if no improvement or this gets worse, would consider neuro referral

## 2020-11-22 ENCOUNTER — Ambulatory Visit: Payer: Medicare Other | Admitting: Nurse Practitioner

## 2020-11-27 ENCOUNTER — Other Ambulatory Visit: Payer: Self-pay

## 2020-11-27 DIAGNOSIS — M546 Pain in thoracic spine: Secondary | ICD-10-CM

## 2020-11-27 MED ORDER — TIZANIDINE HCL 4 MG PO TABS: 4.0000 mg | ORAL_TABLET | Freq: Four times a day (QID) | ORAL | 0 refills | Status: DC | PRN

## 2020-12-14 ENCOUNTER — Encounter (INDEPENDENT_AMBULATORY_CARE_PROVIDER_SITE_OTHER): Payer: Self-pay

## 2020-12-14 ENCOUNTER — Ambulatory Visit (INDEPENDENT_AMBULATORY_CARE_PROVIDER_SITE_OTHER): Payer: Medicare Other | Admitting: *Deleted

## 2020-12-14 ENCOUNTER — Other Ambulatory Visit: Payer: Self-pay

## 2020-12-14 DIAGNOSIS — Z0000 Encounter for general adult medical examination without abnormal findings: Secondary | ICD-10-CM

## 2020-12-14 NOTE — Progress Notes (Signed)
Subjective:   Joshua Sanchez is a 20 y.o. male who presents for an Initial Medicare Annual Wellness Visit.  I connected with  Joshua Sanchez on 12/14/20 by an audio  enabled telemedicine application and verified that I am speaking with the correct person using two identifiers.   I discussed the limitations, risks, security and privacy concerns of performing an evaluation and management service by telephone and the availability of in person appointments. I also discussed with the patient that there may be a patient responsible charge related to this service. The patient expressed understanding and verbally consented to this telephonic visit.   Review of Systems     Cardiac Risk Factors include: obesity (BMI >30kg/m2);sedentary lifestyle     Objective:    Today's Vitals   12/14/20 1123  PainSc: 0-No pain   There is no height or weight on file to calculate BMI.  Advanced Directives 12/14/2020 06/26/2020 11/01/2018 05/22/2017 11/01/2014 08/26/2014 10/23/2013  Does Patient Have a Medical Advance Directive? No No No No No No No  Would patient like information on creating a medical advance directive? Yes (ED - Information included in AVS) - No - Patient declined No - Patient declined No - patient declined information No - patient declined information -    Current Medications (verified) Outpatient Encounter Medications as of 12/14/2020  Medication Sig   acetaminophen (TYLENOL) 500 MG tablet Take 1 tablet (500 mg total) by mouth every 6 (six) hours as needed.   buPROPion (WELLBUTRIN XL) 300 MG 24 hr tablet Take 1 tablet by mouth every morning.   busPIRone (BUSPAR) 5 MG tablet Take 5 mg by mouth 2 (two) times daily.   cloNIDine (CATAPRES) 0.2 MG tablet Take 0.2 mg by mouth at bedtime.   hydrOXYzine (VISTARIL) 25 MG capsule Take 25 mg by mouth every 6 (six) hours as needed.   levETIRAcetam (KEPPRA) 1000 MG tablet TAKE 1 TABLET(1000 MG) BY MOUTH TWICE DAILY   lisinopril (ZESTRIL) 5 MG tablet Take 5 mg  by mouth daily.   loratadine (CLARITIN) 10 MG tablet Take 10 mg by mouth daily.    naproxen (NAPROSYN) 500 MG tablet Take 500 mg by mouth 2 (two) times daily.   QUEtiapine (SEROQUEL XR) 50 MG TB24 24 hr tablet Take by mouth.   tiZANidine (ZANAFLEX) 4 MG tablet Take 1 tablet (4 mg total) by mouth every 6 (six) hours as needed for muscle spasms.   No facility-administered encounter medications on file as of 12/14/2020.    Allergies (verified) Patient has no known allergies.   History: Past Medical History:  Diagnosis Date   ADHD (attention deficit hyperactivity disorder)    Autism    OCD (obsessive compulsive disorder)    Seizures (HCC)    focal   Separation anxiety    Past Surgical History:  Procedure Laterality Date   TONSILLECTOMY     Family History  Adopted: Yes  Problem Relation Age of Onset   Cancer Neg Hx    Clotting disorder Neg Hx    Rheumatologic disease Neg Hx    Social History   Socioeconomic History   Marital status: Single    Spouse name: Not on file   Number of children: Not on file   Years of education: Not on file   Highest education level: Not on file  Occupational History   Not on file  Tobacco Use   Smoking status: Never   Smokeless tobacco: Never  Vaping Use   Vaping Use: Never used  Substance  and Sexual Activity   Alcohol use: No   Drug use: No   Sexual activity: Never  Other Topics Concern   Not on file  Social History Narrative   Right handed      Highest level of edu- 12th grade      Lives with mom   Social Determinants of Health   Financial Resource Strain: Low Risk    Difficulty of Paying Living Expenses: Not hard at all  Food Insecurity: No Food Insecurity   Worried About Programme researcher, broadcasting/film/video in the Last Year: Never true   Ran Out of Food in the Last Year: Never true  Transportation Needs: No Transportation Needs   Lack of Transportation (Medical): No   Lack of Transportation (Non-Medical): No  Physical Activity: Inactive    Days of Exercise per Week: 0 days   Minutes of Exercise per Session: 0 min  Stress: No Stress Concern Present   Feeling of Stress : Not at all  Social Connections: Socially Isolated   Frequency of Communication with Friends and Family: More than three times a week   Frequency of Social Gatherings with Friends and Family: More than three times a week   Attends Religious Services: Never   Database administrator or Organizations: No   Attends Engineer, structural: Never   Marital Status: Never married    Tobacco Counseling Counseling given: Not Answered   Clinical Intake:  Pre-visit preparation completed: Yes  Pain : No/denies pain Pain Score: 0-No pain     Nutritional Status: BMI > 30  Obese Nutritional Risks: None Diabetes: No  How often do you need to have someone help you when you read instructions, pamphlets, or other written materials from your doctor or pharmacy?: 1 - Never What is the last grade level you completed in school?: 12th grade  Diabetic?no  Interpreter Needed?: No      Activities of Daily Living In your present state of health, do you have any difficulty performing the following activities: 12/14/2020  Hearing? N  Vision? N  Difficulty concentrating or making decisions? N  Walking or climbing stairs? N  Dressing or bathing? N  Doing errands, shopping? N  Preparing Food and eating ? N  Using the Toilet? N  In the past six months, have you accidently leaked urine? N  Do you have problems with loss of bowel control? N  Managing your Medications? N  Managing your Finances? N  Housekeeping or managing your Housekeeping? N  Some recent data might be hidden    Patient Care Team: Heather Roberts, NP as PCP - General (Nurse Practitioner) Van Clines, MD as Consulting Physician (Neurology)  Indicate any recent Medical Services you may have received from other than [facility] providers in the past year (date may be approximate).      Assessment:   This is a routine wellness examination for Joshua Sanchez.  Hearing/Vision screen No results found.  Dietary issues and exercise activities discussed: Current Exercise Habits: The patient does not participate in regular exercise at present, Exercise limited by: None identified   Goals Addressed             This Visit's Progress    Patient Stated       Would like to go to college      Depression Screen PHQ 2/9 Scores 12/14/2020 12/14/2020 11/21/2020 11/13/2020 12/28/2014 11/04/2014  PHQ - 2 Score 0 0 0 2 4 4   PHQ- 9 Score 0 0 -  8 15 12     Fall Risk Fall Risk  12/14/2020 11/21/2020 11/13/2020 06/26/2020 12/01/2018  Falls in the past year? 0 0 1 0 1  Number falls in past yr: 0 0 0 0 0  Injury with Fall? 0 0 0 0 0  Risk for fall due to : No Fall Risks No Fall Risks No Fall Risks - Mental status change  Risk for fall due to: Comment - - - - Seizure  Follow up Falls evaluation completed Falls evaluation completed Falls evaluation completed - Falls evaluation completed    FALL RISK PREVENTION PERTAINING TO THE HOME:  Any stairs in or around the home? Yes  If so, are there any without handrails? Yes  Home free of loose throw rugs in walkways, pet beds, electrical cords, etc? Yes  Adequate lighting in your home to reduce risk of falls? Yes   ASSISTIVE DEVICES UTILIZED TO PREVENT FALLS:  Life alert? No  Use of a cane, walker or w/c? No  Grab bars in the bathroom? No  Shower chair or bench in shower? No  Elevated toilet seat or a handicapped toilet? No   TIMED UP AND GO:  Was the test performed? No .  Length of time to ambulate 10 feet: NA sec.     Cognitive Function: MMSE - Mini Mental State Exam 12/14/2020  Not completed: Unable to complete     6CIT Screen 12/14/2020  What Year? 0 points  What month? 0 points  What time? 0 points  Count back from 20 0 points  Months in reverse 0 points  Repeat phrase 0 points  Total Score 0    Immunizations Immunization  History  Administered Date(s) Administered   Influenza,inj,Quad PF,6+ Mos 12/07/2017, 12/14/2018    TDAP status: Due, Education has been provided regarding the importance of this vaccine. Advised may receive this vaccine at local pharmacy or Health Dept. Aware to provide a copy of the vaccination record if obtained from local pharmacy or Health Dept. Verbalized acceptance and understanding.    Covid-19 vaccine status: Completed vaccines  Qualifies for Shingles Vaccine? No   Zostavax completed No   Shingrix Completed?: No.    Education has been provided regarding the importance of this vaccine. Patient has been advised to call insurance company to determine out of pocket expense if they have not yet received this vaccine. Advised may also receive vaccine at local pharmacy or Health Dept. Verbalized acceptance and understanding.  Screening Tests Health Maintenance  Topic Date Due   COVID-19 Vaccine (1) Never done   HPV VACCINES (1 - Male 2-dose series) Never done   Hepatitis C Screening  Never done   TETANUS/TDAP  Never done   INFLUENZA VACCINE  09/18/2020   HIV Screening  11/13/2021 (Originally 12/03/2014)   Pneumococcal Vaccine 81-8 Years old  Aged Out    Health Maintenance  Health Maintenance Due  Topic Date Due   COVID-19 Vaccine (1) Never done   HPV VACCINES (1 - Male 2-dose series) Never done   Hepatitis C Screening  Never done   TETANUS/TDAP  Never done   INFLUENZA VACCINE  09/18/2020      Lung Cancer Screening: (Low Dose CT Chest recommended if Age 54-80 years, 30 pack-year currently smoking OR have quit w/in 15years.) does not qualify.   Lung Cancer Screening Referral: NA  Additional Screening:  Hepatitis C Screening: does not qualify; Completed   Vision Screening: Recommended annual ophthalmology exams for early detection of glaucoma and other disorders  of the eye. Is the patient up to date with their annual eye exam?  No  Who is the provider or what is the  name of the office in which the patient attends annual eye exams? [facility]  If pt is not established with a provider, would they like to be referred to a provider to establish care? No .   Dental Screening: Recommended annual dental exams for proper oral hygiene  Community Resource Referral / Chronic Care Management: CRR required this visit?  No   CCM required this visit?  No      Plan:     I have personally reviewed and noted the following in the patient's chart:   Medical and social history Use of alcohol, tobacco or illicit drugs  Current medications and supplements including opioid prescriptions. Patient is not currently taking opioid prescriptions. Functional ability and status Nutritional status Physical activity Advanced directives List of other physicians Hospitalizations, surgeries, and ER visits in previous 12 months Vitals Screenings to include cognitive, depression, and falls Referrals and appointments  In addition, I have reviewed and discussed with patient certain preventive protocols, quality metrics, and best practice recommendations. A written personalized care plan for preventive services as well as general preventive health recommendations were provided to patient.     Park Breed, CMA   12/14/2020   Nurse Notes: This was a telehealth visit. The patient was at home. The provider was in the office and was Trena Platt, MD.

## 2020-12-14 NOTE — Patient Instructions (Signed)
Mr. Hott , Thank you for taking time to come for your Medicare Wellness Visit. I appreciate your ongoing commitment to your health goals. Please review the following plan we discussed and let me know if I can assist you in the future.   Screening recommendations/referrals:  Recommended yearly ophthalmology/optometry visit for glaucoma screening and checkup Recommended yearly dental visit for hygiene and checkup  Vaccinations: Influenza vaccine: Completed Tdap vaccine: Due now     Advanced directives: information provided    Next appointment: 1 year  Preventive Care 40-64 Years, Male Preventive care refers to lifestyle choices and visits with your health care provider that can promote health and wellness. What does preventive care include? A yearly physical exam. This is also called an annual well check. Dental exams once or twice a year. Routine eye exams. Ask your health care provider how often you should have your eyes checked. Personal lifestyle choices, including: Daily care of your teeth and gums. Regular physical activity. Eating a healthy diet. Avoiding tobacco and drug use. Limiting alcohol use. Practicing safe sex. Taking low-dose aspirin every day starting at age 40. What happens during an annual well check? The services and screenings done by your health care provider during your annual well check will depend on your age, overall health, lifestyle risk factors, and family history of disease. Counseling  Your health care provider may ask you questions about your: Alcohol use. Tobacco use. Drug use. Emotional well-being. Home and relationship well-being. Sexual activity. Eating habits. Work and work Astronomer. Screening  You may have the following tests or measurements: Height, weight, and BMI. Blood pressure. Lipid and cholesterol levels. These may be checked every 5 years, or more frequently if you are over 67 years old. Skin check. Lung cancer  screening. You may have this screening every year starting at age 22 if you have a 30-pack-year history of smoking and currently smoke or have quit within the past 15 years. Fecal occult blood test (FOBT) of the stool. You may have this test every year starting at age 48. Flexible sigmoidoscopy or colonoscopy. You may have a sigmoidoscopy every 5 years or a colonoscopy every 10 years starting at age 5. Prostate cancer screening. Recommendations will vary depending on your family history and other risks. Hepatitis C blood test. Hepatitis B blood test. Sexually transmitted disease (STD) testing. Diabetes screening. This is done by checking your blood sugar (glucose) after you have not eaten for a while (fasting). You may have this done every 1-3 years. Discuss your test results, treatment options, and if necessary, the need for more tests with your health care provider. Vaccines  Your health care provider may recommend certain vaccines, such as: Influenza vaccine. This is recommended every year. Tetanus, diphtheria, and acellular pertussis (Tdap, Td) vaccine. You may need a Td booster every 10 years. Zoster vaccine. You may need this after age 24. Pneumococcal 13-valent conjugate (PCV13) vaccine. You may need this if you have certain conditions and have not been vaccinated. Pneumococcal polysaccharide (PPSV23) vaccine. You may need one or two doses if you smoke cigarettes or if you have certain conditions. Talk to your health care provider about which screenings and vaccines you need and how often you need them. This information is not intended to replace advice given to you by your health care provider. Make sure you discuss any questions you have with your health care provider. Document Released: 03/03/2015 Document Revised: 10/25/2015 Document Reviewed: 12/06/2014 Elsevier Interactive Patient Education  2017 [employer].  Fall Prevention in the Home Falls can cause injuries. They can happen  to people of all ages. There are many things you can do to make your home safe and to help prevent falls. What can I do on the outside of my home? Regularly fix the edges of walkways and driveways and fix any cracks. Remove anything that might make you trip as you walk through a door, such as a raised step or threshold. Trim any bushes or trees on the path to your home. Use bright outdoor lighting. Clear any walking paths of anything that might make someone trip, such as rocks or tools. Regularly check to see if handrails are loose or broken. Make sure that both sides of any steps have handrails. Any raised decks and porches should have guardrails on the edges. Have any leaves, snow, or ice cleared regularly. Use sand or salt on walking paths during winter. Clean up any spills in your garage right away. This includes oil or grease spills. What can I do in the bathroom? Use night lights. Install grab bars by the toilet and in the tub and shower. Do not use towel bars as grab bars. Use non-skid mats or decals in the tub or shower. If you need to sit down in the shower, use a plastic, non-slip stool. Keep the floor dry. Clean up any water that spills on the floor as soon as it happens. Remove soap buildup in the tub or shower regularly. Attach bath mats securely with double-sided non-slip rug tape. Do not have throw rugs and other things on the floor that can make you trip. What can I do in the bedroom? Use night lights. Make sure that you have a light by your bed that is easy to reach. Do not use any sheets or blankets that are too big for your bed. They should not hang down onto the floor. Have a firm chair that has side arms. You can use this for support while you get dressed. Do not have throw rugs and other things on the floor that can make you trip. What can I do in the kitchen? Clean up any spills right away. Avoid walking on wet floors. Keep items that you use a lot in  easy-to-reach places. If you need to reach something above you, use a strong step stool that has a grab bar. Keep electrical cords out of the way. Do not use floor polish or wax that makes floors slippery. If you must use wax, use non-skid floor wax. Do not have throw rugs and other things on the floor that can make you trip. What can I do with my stairs? Do not leave any items on the stairs. Make sure that there are handrails on both sides of the stairs and use them. Fix handrails that are broken or loose. Make sure that handrails are as long as the stairways. Check any carpeting to make sure that it is firmly attached to the stairs. Fix any carpet that is loose or worn. Avoid having throw rugs at the top or bottom of the stairs. If you do have throw rugs, attach them to the floor with carpet tape. Make sure that you have a light switch at the top of the stairs and the bottom of the stairs. If you do not have them, ask someone to add them for you. What else can I do to help prevent falls? Wear shoes that: Do not have high heels. Have rubber bottoms. Are comfortable and fit  you well. Are closed at the toe. Do not wear sandals. If you use a stepladder: Make sure that it is fully opened. Do not climb a closed stepladder. Make sure that both sides of the stepladder are locked into place. Ask someone to hold it for you, if possible. Clearly mark and make sure that you can see: Any grab bars or handrails. First and last steps. Where the edge of each step is. Use tools that help you move around (mobility aids) if they are needed. These include: Canes. Walkers. Scooters. Crutches. Turn on the lights when you go into a dark area. Replace any light bulbs as soon as they burn out. Set up your furniture so you have a clear path. Avoid moving your furniture around. If any of your floors are uneven, fix them. If there are any pets around you, be aware of where they are. Review your medicines  with your doctor. Some medicines can make you feel dizzy. This can increase your chance of falling. Ask your doctor what other things that you can do to help prevent falls. This information is not intended to replace advice given to you by your health care provider. Make sure you discuss any questions you have with your health care provider. Document Released: 12/01/2008 Document Revised: 07/13/2015 Document Reviewed: 03/11/2014 Elsevier Interactive Patient Education  2017 [employer].

## 2021-01-23 ENCOUNTER — Ambulatory Visit: Payer: Medicare Other | Admitting: Nurse Practitioner

## 2021-03-01 ENCOUNTER — Encounter: Payer: Self-pay | Admitting: Internal Medicine

## 2021-03-01 ENCOUNTER — Ambulatory Visit (INDEPENDENT_AMBULATORY_CARE_PROVIDER_SITE_OTHER): Payer: Medicare Other | Admitting: Internal Medicine

## 2021-03-01 ENCOUNTER — Other Ambulatory Visit: Payer: Self-pay

## 2021-03-01 DIAGNOSIS — M25561 Pain in right knee: Secondary | ICD-10-CM

## 2021-03-01 MED ORDER — IBUPROFEN 600 MG PO TABS: 600.0000 mg | ORAL_TABLET | Freq: Three times a day (TID) | ORAL | 0 refills | Status: DC | PRN

## 2021-03-01 NOTE — Progress Notes (Signed)
Virtual Visit via Telephone Note   This visit type was conducted due to national recommendations for restrictions regarding the COVID-19 Pandemic (e.g. social distancing) in an effort to limit this patient's exposure and mitigate transmission in our community.  Due to his co-morbid illnesses, this patient is at least at moderate risk for complications without adequate follow up.  This format is felt to be most appropriate for this patient at this time.  The patient did not have access to video technology/had technical difficulties with video requiring transitioning to audio format only (telephone).  All issues noted in this document were discussed and addressed.  No physical exam could be performed with this format.  Evaluation Performed:  Follow-up visit  Date:  03/01/2021   ID:  Joshua Sanchez, DOB 26-Apr-2000, MRN [id number]  Patient Location: Home Provider Location: Office/Clinic  Participants: Patient Location of Patient: Home Location of Provider: Telehealth Consent was obtain for visit to be over via telehealth. I verified that I am speaking with the correct person using two identifiers.  PCP:  Heather Roberts, NP   Chief Complaint: Right knee pain  History of Present Illness:    Joshua Sanchez is a 21 y.o. male who has a televisit for right knee pain since this morning.  Pain is 4/10, dull and is worse upon walking. He denies any recent fall, but does report mild twisting injury.  He denies any local swelling.  He has not tried any OTC medication for pain.  The patient does not have symptoms concerning for COVID-19 infection (fever, chills, cough, or new shortness of breath).   Past Medical, Surgical, Social History, Allergies, and Medications have been Reviewed.  Past Medical History:  Diagnosis Date   ADHD (attention deficit hyperactivity disorder)    Autism    OCD (obsessive compulsive disorder)    Seizures (HCC)    focal   Separation anxiety    Past Surgical  History:  Procedure Laterality Date   TONSILLECTOMY       Current Meds  Medication Sig   acetaminophen (TYLENOL) 500 MG tablet Take 1 tablet (500 mg total) by mouth every 6 (six) hours as needed.   buPROPion (WELLBUTRIN XL) 300 MG 24 hr tablet Take 1 tablet by mouth every morning.   busPIRone (BUSPAR) 5 MG tablet Take 5 mg by mouth 2 (two) times daily.   cloNIDine (CATAPRES) 0.2 MG tablet Take 0.2 mg by mouth at bedtime.   hydrOXYzine (VISTARIL) 25 MG capsule Take 25 mg by mouth every 6 (six) hours as needed.   levETIRAcetam (KEPPRA) 1000 MG tablet TAKE 1 TABLET(1000 MG) BY MOUTH TWICE DAILY   lisinopril (ZESTRIL) 5 MG tablet Take 5 mg by mouth daily.   loratadine (CLARITIN) 10 MG tablet Take 10 mg by mouth daily.    naproxen (NAPROSYN) 500 MG tablet Take 500 mg by mouth 2 (two) times daily.   QUEtiapine (SEROQUEL XR) 50 MG TB24 24 hr tablet Take by mouth.   tiZANidine (ZANAFLEX) 4 MG tablet Take 1 tablet (4 mg total) by mouth every 6 (six) hours as needed for muscle spasms.     Allergies:   Patient has no known allergies.   ROS:   Please see the history of present illness.     All other systems reviewed and are negative.   Labs/Other Tests and Data Reviewed:    Recent Labs: No results found for requested labs within last 8760 hours.   Recent Lipid Panel No results found for:  CHOL, TRIG, HDL, CHOLHDL, LDLCALC, LDLDIRECT  Wt Readings from Last 3 Encounters:  11/21/20 298 lb 0.6 oz (135.2 kg)  11/13/20 300 lb (136.1 kg)  06/26/20 263 lb 9.6 oz (119.6 kg)     ASSESSMENT & PLAN:    Right knee pain Mild currently Could be related to twisting injury Advised to contact if he has swelling or erythema Ibuprofen as needed  Time:   Today, I have spent 6 minutes reviewing the chart, including problem list, medications, and with the patient with telehealth technology discussing the above problems.   Medication Adjustments/Labs and Tests Ordered: Current medicines are  reviewed at length with the patient today.  Concerns regarding medicines are outlined above.   Tests Ordered: No orders of the defined types were placed in this encounter.   Medication Changes: No orders of the defined types were placed in this encounter.    Note: This dictation was prepared with Dragon dictation along with smaller phrase technology. Similar sounding words can be transcribed inadequately or may not be corrected upon review. Any transcriptional errors that result from this process are unintentional.      Disposition:  Follow up  Signed, Anabel Halon, MD  03/01/2021 4:19 PM     [facility]

## 2021-03-01 NOTE — Patient Instructions (Signed)
Please take ibuprofen as needed for mild to moderate knee pain.  Please contact us if you have persistent knee pain or new swelling around knee area.

## 2021-03-19 ENCOUNTER — Other Ambulatory Visit: Payer: Self-pay | Admitting: Neurology

## 2021-05-28 ENCOUNTER — Other Ambulatory Visit: Payer: Self-pay | Admitting: Family Medicine

## 2021-05-28 ENCOUNTER — Telehealth: Payer: Self-pay | Admitting: Nurse Practitioner

## 2021-05-28 NOTE — Telephone Encounter (Signed)
Patient needs refill on  ? ?loratadine (CLARITIN) 10 MG tablet  ? ?Pt appt on 4/19 ? ?Wants a call back when sent in  ?

## 2021-05-29 NOTE — Telephone Encounter (Signed)
Rx was sent 4/10

## 2021-06-06 ENCOUNTER — Ambulatory Visit (INDEPENDENT_AMBULATORY_CARE_PROVIDER_SITE_OTHER): Payer: Medicare Other | Admitting: Nurse Practitioner

## 2021-06-06 ENCOUNTER — Encounter: Payer: Self-pay | Admitting: Nurse Practitioner

## 2021-06-06 VITALS — BP 138/88 | HR 98 | Ht 69.0 in | Wt 296.0 lb

## 2021-06-06 DIAGNOSIS — R519 Headache, unspecified: Secondary | ICD-10-CM

## 2021-06-06 DIAGNOSIS — F32A Depression, unspecified: Secondary | ICD-10-CM

## 2021-06-06 DIAGNOSIS — F419 Anxiety disorder, unspecified: Secondary | ICD-10-CM

## 2021-06-06 DIAGNOSIS — I10 Essential (primary) hypertension: Secondary | ICD-10-CM | POA: Insufficient documentation

## 2021-06-06 DIAGNOSIS — M546 Pain in thoracic spine: Secondary | ICD-10-CM

## 2021-06-06 NOTE — Assessment & Plan Note (Signed)
BP Readings from Last 3 Encounters:  ?06/06/21 138/88  ?11/21/20 121/77  ?11/13/20 120/73  ?Currently on lisinopril 10 mg daily ?Blood pressure well controlled in the office today ?DASH diet advised, engage in regular vigorous exercises at least 150 minutes weekly. ?CMP plus EGFR today ?

## 2021-06-06 NOTE — Assessment & Plan Note (Addendum)
Currently denies pain ?Takes Tylenol as needed ?Patient encouraged to do stretching exercises ?Apply heat to affected site. ?Engage in daily exercises to lose weight ?

## 2021-06-06 NOTE — Patient Instructions (Signed)
Please get your TDAP vaccines and HPV vaccine at your pharmacy. ? ?It is important that you exercise regularly at least 30 minutes 5 times a week.  ?Think about what you will eat, plan ahead. ?Choose " clean, green, fresh or frozen" over canned, processed or packaged foods which are more sugary, salty and fatty. ?70 to 75% of food eaten should be vegetables and fruit. ?Three meals at set times with snacks allowed between meals, but they must be fruit or vegetables. ?Aim to eat over a 12 hour period , example 7 am to 7 pm, and STOP after  your last meal of the day. ?Drink water,generally about 64 ounces per day, no other drink is as healthy. Fruit juice is best enjoyed in a healthy way, by EATING the fruit. ? ?Thanks for choosing [facility], we consider it a privelige to serve you. ? ?

## 2021-06-06 NOTE — Assessment & Plan Note (Signed)
Chronic condition relieved with Tylenol ?Seeing  neurology for seizures, denies recent seizures ?I advised patient to contact neurology about his headache. ? he verbalized understanding ?Continue Tylenol 500 mg every 6 hours as needed for now ?

## 2021-06-06 NOTE — Assessment & Plan Note (Signed)
Wt Readings from Last 3 Encounters:  ?06/06/21 296 lb (134.3 kg)  ?11/21/20 298 lb 0.6 oz (135.2 kg)  ?11/13/20 300 lb (136.1 kg)  ?I discussed that losing weight will benefits the patient hypertension and back pain. ?Need to increase intake of whole food consisting mainly vegetables proteins less carbohydrate drink at least 64 ounces of water daily engaging in daily vigorous exercise and at least 150 minutes weekly importance of portion control also discussed with patient. ?He verbalized understanding. ?

## 2021-06-06 NOTE — Assessment & Plan Note (Addendum)
Uncontrolled condition ?PHQ 9 score 21 ?Denies SI HI ?On Wellbutrin 300 mg daily, buspirone 10 mg twice daily clonidine 0.2 mg at bedtime hydroxyzine 25 mg every 6 hours as needed.seroquel 150mg  daily ?Patient advised to follow-up with psych ?States that he would call to schedule an appointment. ?

## 2021-06-06 NOTE — Progress Notes (Signed)
? ?Joshua Sanchez     MRN: [id number]      DOB: [birth year] ? ? ?HPI ?Mr. Joshua Sanchez with past medical history of hypertension, seizures, anxiety and depression, morbid obesity, headache, back pain is here for follow up and re-evaluation of chronic medical conditions. ? ?Pt c/o chronic HA, HA getting worse and better at the same, seeing neurology for sleep apnea and seizure. Has HA about 15 days in month, throbbing pain located to the right side frontal part of his head 8/10 when its worse, denies nausea vomiting, changes in vision with his HA. Sometimes sees spots , HA is worse when he has anxiety. Tylenol helps his HA.  ? ?pT c/o middle back low pain for years , back pain worse since the last 2 months, denies trauma, throbbing sharp pain depending on how he moves. Pain is 7/10 with activity, tylenol does not helping. Has not tried  ? ?Currently seeing psych for his mental health disorders. Denies SI, HI.  ? ? ? ? ?ROS ?Denies recent fever or chills. ?Denies sinus pressure, nasal congestion, ear pain or sore throat. ?Denies chest congestion, productive cough or wheezing. ?Denies chest pains, palpitations and leg swelling ?Denies abdominal pain, nausea, vomiting,diarrhea or constipation.   ?Denies dysuria, frequency, hesitancy or incontinence. ?Denies swelling and limitation in mobility. ?Denies , seizures, numbness, or tingling. ? ? ? ? ?PE ? ?BP 138/88 (BP Location: Right Arm, Cuff Size: Large)   Pulse 98   Ht 5' 9"  (1.753 m)   Wt 296 lb (134.3 kg)   SpO2 98%   BMI 43.71 kg/m?  ? ?Patient alert and oriented and in no cardiopulmonary distress. ? ?HEENT: No facial asymmetry, EOMI,     Neck supple . ? ?Chest: Clear to auscultation bilaterally. ? ?CVS: S1, S2 no murmurs, no S3.Regular rate. ? ?ABD: Soft non tender.  ? ?Ext: No edema ? ?MS: Adequate ROM spine, shoulders, hips and knees. ? ?Psych: Good eye contact, normal affect. Memory intact not anxious or depressed appearing. ? ?CNS: CN 2-12 intact, power,  normal  throughout.no focal deficits noted. ? ? ?Assessment & Plan ?Hypertension ?BP Readings from Last 3 Encounters:  ?06/06/21 138/88  ?11/21/20 121/77  ?11/13/20 120/73  ?Currently on lisinopril 10 mg daily ?Blood pressure well controlled in the office today ?DASH diet advised, engage in regular vigorous exercises at least 150 minutes weekly. ?CMP plus EGFR today ? ?Anxiety and depression ?Uncontrolled condition ?PHQ 9 score 21 ?Denies SI HI ?On Wellbutrin 300 mg daily, buspirone 10 mg twice daily clonidine 0.2 mg at bedtime hydroxyzine 25 mg every 6 hours as needed.seroquel 16m daily ?Patient advised to follow-up with psych ?States that he would call to schedule an appointment. ? ?Back pain ?Currently denies pain ?Takes Tylenol as needed ?Patient encouraged to do stretching exercises ?Apply heat to affected site. ?Engage in daily exercises to lose weight ? ?Morbid obesity (HGonzales ?Wt Readings from Last 3 Encounters:  ?06/06/21 296 lb (134.3 kg)  ?11/21/20 298 lb 0.6 oz (135.2 kg)  ?11/13/20 300 lb (136.1 kg)  ?I discussed that losing weight will benefits the patient hypertension and back pain. ?Need to increase intake of whole food consisting mainly vegetables proteins less carbohydrate drink at least 64 ounces of water daily engaging in daily vigorous exercise and at least 150 minutes weekly importance of portion control also discussed with patient. ?He verbalized understanding. ? ?Headache ?Chronic condition relieved with Tylenol ?Seeing  neurology for seizures, denies recent seizures ?I advised patient to contact neurology about  his headache. ? he verbalized understanding ?Continue Tylenol 500 mg every 6 hours as needed for now  ? ?

## 2021-06-07 ENCOUNTER — Telehealth: Payer: Self-pay | Admitting: Neurology

## 2021-06-07 ENCOUNTER — Telehealth: Payer: Self-pay

## 2021-06-07 LAB — CBC WITH DIFFERENTIAL/PLATELET
Basophils Absolute: 0 10*3/uL (ref 0.0–0.2)
Basos: 0 %
EOS (ABSOLUTE): 0.3 10*3/uL (ref 0.0–0.4)
Eos: 3 %
Hematocrit: 45.3 % (ref 37.5–51.0)
Hemoglobin: 14.2 g/dL (ref 13.0–17.7)
Immature Grans (Abs): 0 10*3/uL (ref 0.0–0.1)
Immature Granulocytes: 0 %
Lymphocytes Absolute: 2.9 10*3/uL (ref 0.7–3.1)
Lymphs: 24 %
MCH: 24.7 pg — ABNORMAL LOW (ref 26.6–33.0)
MCHC: 31.3 g/dL — ABNORMAL LOW (ref 31.5–35.7)
MCV: 79 fL (ref 79–97)
Monocytes Absolute: 0.9 10*3/uL (ref 0.1–0.9)
Monocytes: 7 %
Neutrophils Absolute: 8 10*3/uL — ABNORMAL HIGH (ref 1.4–7.0)
Neutrophils: 66 %
Platelets: 236 10*3/uL (ref 150–450)
RBC: 5.76 x10E6/uL (ref 4.14–5.80)
RDW: 14.5 % (ref 11.6–15.4)
WBC: 12.1 10*3/uL — ABNORMAL HIGH (ref 3.4–10.8)

## 2021-06-07 LAB — CMP14+EGFR
ALT: 26 IU/L (ref 0–44)
AST: 20 IU/L (ref 0–40)
Albumin/Globulin Ratio: 1.3 (ref 1.2–2.2)
Albumin: 4.3 g/dL (ref 4.1–5.2)
Alkaline Phosphatase: 89 IU/L (ref 44–121)
BUN/Creatinine Ratio: 6 — ABNORMAL LOW (ref 9–20)
BUN: 7 mg/dL (ref 6–20)
Bilirubin Total: 0.3 mg/dL (ref 0.0–1.2)
CO2: 24 mmol/L (ref 20–29)
Calcium: 9.7 mg/dL (ref 8.7–10.2)
Chloride: 102 mmol/L (ref 96–106)
Creatinine, Ser: 1.25 mg/dL (ref 0.76–1.27)
Globulin, Total: 3.2 g/dL (ref 1.5–4.5)
Glucose: 88 mg/dL (ref 70–99)
Potassium: 4.5 mmol/L (ref 3.5–5.2)
Sodium: 143 mmol/L (ref 134–144)
Total Protein: 7.5 g/dL (ref 6.0–8.5)
eGFR: 84 mL/min/{1.73_m2} (ref >59)

## 2021-06-07 NOTE — Progress Notes (Signed)
WBC and neutrophils is elevated, pt should report fever, chills, night sweats.  ?Has some anemia,, we will check his iron levels  at next visit if numbers are still low.  ?

## 2021-06-07 NOTE — Telephone Encounter (Signed)
Pt c/o: seizure ?Missed medications?  yes ?Sleep deprived?  Yes staying up late ?Alcohol intake?  no ?Back to their usual baseline self? Yes . If no, advise go to ER ?Current medications prescribed by Dr. Karel Jarvis: Levetiracetam 1000mg  bid has an appt May 9th please advise.   ?

## 2021-06-07 NOTE — Telephone Encounter (Signed)
Patients mother called, othon had a seizure yesterday and bit his tongue pretty bad. It lasted about 10 minutes. EMS was called, they told her to call our office to see about getting a sooner appt.  ?

## 2021-06-07 NOTE — Telephone Encounter (Signed)
Sleep deprivation can trigger seizures, pls try to improve sleep hygiene, no caffeine after 3pm, no screen/phones before bedtime/in bed. Pls have him increase Levetiracetam 1000mg : Take 1 and 1/2 tabs BID, pls send in updated Rx, thanks ?

## 2021-06-08 NOTE — Telephone Encounter (Signed)
Advised, will call when Rx is needed for update, she just had Rx filled. Thanked [place] for calling.  ?

## 2021-06-08 NOTE — Telephone Encounter (Signed)
Yes pls increase the Keppra to 1500mg  BID and improve sleep hygiene. No driving until 6 months seizure-free. thanks ?

## 2021-06-08 NOTE — Telephone Encounter (Signed)
No answer 06/08/2021.  ?

## 2021-06-08 NOTE — Telephone Encounter (Signed)
Patient's mom called again to report another seizure, patient bit his tongue. ? ?She also said she'd like to move his appointment up from 06/26/21, if possible. I searched the schedule to no avail at this time. ? ?Ms. Khim said the patient is doing okay now but just sleepy. ?

## 2021-06-26 ENCOUNTER — Encounter: Payer: Self-pay | Admitting: Neurology

## 2021-06-26 ENCOUNTER — Ambulatory Visit (INDEPENDENT_AMBULATORY_CARE_PROVIDER_SITE_OTHER): Payer: Medicare Other | Admitting: Neurology

## 2021-06-26 VITALS — BP 111/77 | HR 84 | Ht 69.0 in | Wt 290.0 lb

## 2021-06-26 DIAGNOSIS — G40309 Generalized idiopathic epilepsy and epileptic syndromes, not intractable, without status epilepticus: Secondary | ICD-10-CM

## 2021-06-26 MED ORDER — LEVETIRACETAM 1000 MG PO TABS: ORAL_TABLET | ORAL | 4 refills | Status: DC

## 2021-06-26 NOTE — Progress Notes (Signed)
? ?NEUROLOGY FOLLOW UP OFFICE NOTE ? ?Joshua HolmesJason Spratlin ?[id number] ?2000/07/27 ? ?HISTORY OF PRESENT ILLNESS: ?I had the pleasure of seeing Joshua Sanchez in follow-up in the neurology clinic on 06/26/2021.  The patient was last seen a year ago for seizures. He is again accompanied by his mother who helps supplemlent the history today.  Records and images were personally reviewed where available.  Since his last visit, his mother called to report 2 seizures in April (4/19 and 4/20) in the setting of sleep deprivation and missing medications. They were instructed to increase Levetiracetam to 1500mg  BID. He denies any prior warning, his mother reports he was awake, she heard him making a crazy noise and found him with his body twisted up for 10 minutes. EMS arrived and he was able to answer their questions. His mother now makes sure he takes his medications regularly, he has recently been sleeping better getting 8 hours of sleep. He denies any alcohol intake. His mother denies any other seizures previously, no staring/unresponsive episodes. He denies any olfactory/gustatory hallucinations,focal numbness/tingling/weakness, myoclonic jerks. No significant headaches, dizziness, vision changes, no falls. He was more tired with the increase in Levetiracetam but this is getting better. He reports mood is fine, his mother reports he can get moody. He sees Psychiatry. She states he would like to work but can't, "he can't function with people." ? ? ?History on Initial Assessment 12/01/2018: This is an 21 year old right-handed man with a history of autism spectrum disorder, ADHD, anxiety, presenting for evaluation of seizures. 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 He would drop things from his hand. One time he got dizzy with the shaking, no associated speech changes or confusion. He was having them at school where teachers would call his mother and  tell him he "did not seem right" and was unable to focus after he alerted them about the hand shaking. Shaking would last 6-7 minutes. [place]" and he was not started on medication. He was having these episodes once a month, no clear triggers. On 11/01/2018, he recalls waking up feeling fine and taking a shower, then waking up in the ambulance. He had bitten his lip and felt diffusely weak with a headache. His mother had heard him fall and came to the bathroom to find him with body jerking, head banging on the floor. She thinks he had urinary incontinence. She tried to get him up and he smacked her, confused. She feels his left side was weaker. [facility] where CBC, CMP, UDS were normal. I personally reviewed head CT without contrast which was unremarkable. He was discharged home on Levetiracetam 500mg  BID. No further convulsions since then, but he had one episode of hand shaking yesterday where he felt funny/uneasy. He feels tired on the Levetiracetam. He reports headaches for the past 4 years occurring every other day, mostly on the left side with pressure and throbbing. Pain can last all day sometimes, with sensitivity to sound. No nausea/vomiting. He usually lays down if headaches are mild, and would take an Ibuprofen sometimes. He denies any dizziness, diplopia, dysarthria/dysphagia, neck pain, bowel/bladder dysfunction. His mother denies any staring/unresponsive episodes. He denies any gaps in time, olfactory/gustatory hallucinations. Over the past 2 years, he has had sleep difficulties, awake at night and asleep during the day. He may take melatonin or  a sleep aid to help sleep at night. His mother administers medications. He does not drive. ? ?He was adopted at age 21, diagnosed with autism spectrum disorder, ADHD, anxiety at age 21 or 21. As far as his mother knows, he had a normal birth and early development, no  febrile convulsions, CNS infections, family history of seizures.  ? ?Diagnostic Data: ?MRI brain with and without contrast done 12/2018 which did not show any acute changes, hippocampi symmetric with no abnormal signal or enhancement seen.  ?1-hour wake and sleep EEG in 02/2019 was normal ? ? ?PAST MEDICAL HISTORY: ?Past Medical History:  ?Diagnosis Date  ? ADHD (attention deficit hyperactivity disorder)   ? Autism   ? OCD (obsessive compulsive disorder)   ? 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 Separation anxiety   ? ? ?MEDICATIONS: ?Current Outpatient Medications on File Prior to Visit  ?Medication Sig Dispense Refill  ? acetaminophen (TYLENOL) 500 MG tablet Take 1 tablet (500 mg total) by mouth every 6 (six) hours as needed. 30 tablet 0  ? buPROPion (WELLBUTRIN XL) 300 MG 24 hr tablet Take 1 tablet by mouth every morning.    ? busPIRone (BUSPAR) 10 MG tablet Take 10 mg by mouth 2 (two) times daily.    ? cloNIDine (CATAPRES) 0.2 MG tablet Take 0.2 mg by mouth at bedtime.    ? hydrOXYzine (VISTARIL) 25 MG capsule Take 25 mg by mouth every 6 (six) hours as needed.    ? levETIRAcetam (KEPPRA) 1000 MG tablet TAKE 1 TABLET(1000 MG) BY MOUTH TWICE DAILY 60 tablet 4  ? lisinopril (ZESTRIL) 10 MG tablet Take 10 mg by mouth daily.    ? loratadine (CLARITIN) 10 MG tablet TAKE 1 TABLET BY MOUTH EVERY DAY FOR ALLERGIES 90 tablet 0  ? SEROQUEL XR 150 MG 24 hr tablet Take 150 mg by mouth daily.    ? ibuprofen (ADVIL) 600 MG tablet Take 1 tablet (600 mg total) by mouth every 8 (eight) hours as needed. (Patient not taking: Reported on 06/06/2021) 30 tablet 0  ? tiZANidine (ZANAFLEX) 4 MG tablet Take 1 tablet (4 mg total) by mouth every 6 (six) hours as needed for muscle spasms. (Patient not taking: Reported on 06/06/2021) 30 tablet 0  ? ?No current facility-administered medications on file prior to visit.  ? ? ?ALLERGIES: ?No Known Allergies ? ?FAMILY HISTORY: ?Family History  ?Adopted: Yes  ?Problem Relation Age of Onset  ? Cancer Neg Hx   ?  Clotting disorder Neg Hx   ? Rheumatologic disease Neg Hx   ? ? ?SOCIAL HISTORY: ?Social History  ? ?Socioeconomic History  ? Marital status: Single  ?  Spouse name: Not on file  ? Number of children: Not on file  ? Years of education: Not on file  ? Highest education level: Not on file  ?Occupational History  ? Not on file  ?Tobacco Use  ? Smoking status: Never  ? Smokeless tobacco: Never  ?Vaping Use  ? Vaping Use: Never used  ?Substance and Sexual Activity  ? Alcohol use: No  ? Drug use: No  ? Sexual activity: Never  ?Other Topics Concern  ? Not on file  ?Social History Narrative  ? Right handed  ?   ? Highest level of edu- 12th grade  ?   ? Lives with mom  ? One story home 5 steps into home  ? Caff. sodas  ? ?Social Determinants of Health  ? ?Financial Resource Strain: Low Risk   ?  Difficulty of Paying Living Expenses: Not hard at all  ?Food Insecurity: No Food Insecurity  ? Worried About Programme researcher, broadcasting/film/video in the Last Year: Never true  ? Ran Out of Food in the Last Year: Never true  ?Transportation Needs: No Transportation Needs  ? Lack of Transportation (Medical): No  ? Lack of Transportation (Non-Medical): No  ?Physical Activity: Inactive  ? Days of Exercise per Week: 0 days  ? Minutes of Exercise per Session: 0 min  ?Stress: No Stress Concern Present  ? Feeling of Stress : Not at all  ?Social Connections: Socially Isolated  ? Frequency of Communication with Friends and Family: More than three times a week  ? Frequency of Social Gatherings with Friends and Family: More than three times a week  ? Attends Religious Services: Never  ? Active Member of Clubs or Organizations: No  ? Attends Banker Meetings: Never  ? Marital Status: Never married  ?Intimate Partner Violence: Not At Risk  ? Fear of Current or Ex-Partner: No  ? Emotionally Abused: No  ? Physically Abused: No  ? Sexually Abused: No  ? ? ? ?PHYSICAL EXAM: ?Vitals:  ? 06/26/21 1527  ?BP: 111/77  ?Pulse: 84  ?SpO2: 96%  ? ?General: No  acute distress ?Head:  Normocephalic/atraumatic ?Skin/Extremities: No rash, no edema ?Neurological Exam: alert and awake. No aphasia or dysarthria. Fund of knowledge is appropriate.  Attention and concentratio

## 2021-06-26 NOTE — Patient Instructions (Signed)
Good to see you.  ? ?Continue Levetiracetam (Keppra) 1000mg : take 1 and 1/2 tablets twice a day ? ?2. Follow-up in 6 months, call for any changes ? ? ?Seizure Precautions: ?1. If medication has been prescribed for you to prevent seizures, take it exactly as directed.  Do not stop taking the medicine without talking to your doctor first, even if you have not had a seizure in a long time.  ? ?2. Avoid activities in which a seizure would cause danger to yourself or to others.  Don't operate dangerous machinery, swim alone, or climb in high or dangerous places, such as on ladders, roofs, or girders.  Do not drive unless your doctor says you may. ? ?3. If you have any warning that you may have a seizure, lay down in a safe place where you can't hurt yourself.   ? ?4.  No driving for 6 months from last seizure, as per [facility].   Please refer to the following link on the [employer] website for more information: [email or web address]  ? ?5.  Maintain good sleep hygiene. Avoid alcohol. ? ?6.  Contact your doctor if you have any problems that may be related to the medicine you are taking. ? ?7.  Call 911 and bring the patient back to the ED if: ?      ? A.  The seizure lasts longer than 5 minutes.      ? B.  The patient doesn't awaken shortly after the seizure ? C.  The patient has new problems such as difficulty seeing, speaking or moving ? D.  The patient was injured during the seizure ? E.  The patient has a temperature over 102 F (39C) ? F.  The patient vomited and now is having trouble breathing ?      ? ?

## 2021-09-12 ENCOUNTER — Other Ambulatory Visit: Payer: Self-pay | Admitting: Family Medicine

## 2021-09-12 ENCOUNTER — Other Ambulatory Visit: Payer: Self-pay | Admitting: Nurse Practitioner

## 2021-09-18 ENCOUNTER — Other Ambulatory Visit: Payer: Self-pay | Admitting: Neurology

## 2021-09-19 ENCOUNTER — Encounter: Payer: Self-pay | Admitting: Nurse Practitioner

## 2021-09-19 ENCOUNTER — Ambulatory Visit (INDEPENDENT_AMBULATORY_CARE_PROVIDER_SITE_OTHER): Payer: Medicare Other | Admitting: Nurse Practitioner

## 2021-09-19 VITALS — BP 133/84 | HR 102 | Ht 70.0 in | Wt 282.0 lb

## 2021-09-19 DIAGNOSIS — Z0000 Encounter for general adult medical examination without abnormal findings: Secondary | ICD-10-CM | POA: Diagnosis not present

## 2021-09-19 DIAGNOSIS — F908 Attention-deficit hyperactivity disorder, other type: Secondary | ICD-10-CM

## 2021-09-19 DIAGNOSIS — F419 Anxiety disorder, unspecified: Secondary | ICD-10-CM

## 2021-09-19 DIAGNOSIS — R718 Other abnormality of red blood cells: Secondary | ICD-10-CM

## 2021-09-19 DIAGNOSIS — F32A Depression, unspecified: Secondary | ICD-10-CM

## 2021-09-19 DIAGNOSIS — I10 Essential (primary) hypertension: Secondary | ICD-10-CM

## 2021-09-19 NOTE — Patient Instructions (Addendum)

## 2021-09-19 NOTE — Assessment & Plan Note (Signed)
Annual exam as documented.  Counseling done include healthy lifestyle involving committing to 150 minutes of exercise per week, heart healthy diet, and attaining healthy weight. The importance of adequate sleep also discussed.  Regular use of seat belt and home safety were also discussed . Changes in health habits are decided on by patient with goals and time frames set for achieving them. Immunization   needs are specifically addressed at this visit.  Tdap and HPV vaccines need for both vaccines discussed he was encouraged to get the vaccines at his pharmacy

## 2021-09-19 NOTE — Assessment & Plan Note (Signed)
BP Readings from Last 3 Encounters:  09/19/21 133/84  06/26/21 111/77  06/06/21 138/88  Chronic condition well-controlled on lisinopril 10 mg daily, clonidine 0.2 mg daily at bedtime Continue current medications DASH diet advised engage in regular vigorous exercise at least 150 minutes weekly Follow-up in 6 months

## 2021-09-19 NOTE — Progress Notes (Signed)
Complete physical exam  Patient: Joshua Sanchez   DOB: April 07, 2000   20 y.o. Male  MRN: [id number]  Subjective:    Chief Complaint  Patient presents with   Annual Exam    cpe    Joshua Sanchez is a 21 y.o. male with past medical history of hypertension, seizures, anxiety and depression, ADHD, morbid obesity who presents today for a complete physical exam. He reports consuming a general diet. Does not exercise.  He generally feels well.   Wears prescription glasses, has had an eye exam within the last year.    Needs TDAP vaccine and HPV vaccine , need for both vaccines discussed with patient patient encouraged to get vaccinated pharmacy    His psychiatrist at [facility] moved to another practise, needs referral to another mental health specialist in [place] .     Most recent fall risk assessment:    09/19/2021    8:08 AM  Fall Risk   Falls in the past year? 0  Number falls in past yr: 0  Injury with Fall? 0  Risk for fall due to : No Fall Risks  Follow up Falls evaluation completed     Most recent depression screenings:    09/19/2021    8:08 AM 06/06/2021    9:11 AM  PHQ 2/9 Scores  PHQ - 2 Score 3 6  PHQ- 9 Score 8 21        Patient Care Team: Donell Beers, FNP as PCP - General (Nurse Practitioner) Van Clines, MD as Consulting Physician (Neurology)   Outpatient Medications Prior to Visit  Medication Sig Note   acetaminophen (TYLENOL) 500 MG tablet Take 1 tablet (500 mg total) by mouth every 6 (six) hours as needed.    buPROPion (WELLBUTRIN XL) 300 MG 24 hr tablet Take 1 tablet by mouth every morning.    busPIRone (BUSPAR) 10 MG tablet Take 10 mg by mouth 2 (two) times daily.    cloNIDine (CATAPRES) 0.2 MG tablet Take 0.2 mg by mouth at bedtime.    hydrOXYzine (VISTARIL) 25 MG capsule Take 25 mg by mouth every 6 (six) hours as needed.    levETIRAcetam (KEPPRA) 1000 MG tablet TAKE 1 TABLET(1000 MG) BY MOUTH TWICE DAILY. 09/19/2021: TAKES 3000mg   daily    lisinopril (ZESTRIL) 10 MG tablet TAKE 1 TABLET BY MOUTH EVERY DAY FOR BLOOD PRESSURE    loratadine (CLARITIN) 10 MG tablet TAKE 1 TABLET BY MOUTH EVERY DAY FOR ALLERGIES    SEROQUEL XR 150 MG 24 hr tablet Take 150 mg by mouth daily.    ibuprofen (ADVIL) 600 MG tablet Take 1 tablet (600 mg total) by mouth every 8 (eight) hours as needed. (Patient not taking: Reported on 06/06/2021)    tiZANidine (ZANAFLEX) 4 MG tablet Take 1 tablet (4 mg total) by mouth every 6 (six) hours as needed for muscle spasms. (Patient not taking: Reported on 06/06/2021)    No facility-administered medications prior to visit.    Review of Systems  Constitutional:  Negative for chills, fever, malaise/fatigue and weight loss.  HENT:  Negative for ear discharge, ear pain, hearing loss, nosebleeds and tinnitus.   Eyes: Negative.  Negative for pain, discharge and redness.  Respiratory: Negative.  Negative for cough, hemoptysis and sputum production.   Cardiovascular: Negative.  Negative for chest pain, palpitations and orthopnea.  Gastrointestinal: Negative.  Negative for heartburn, nausea and vomiting.  Genitourinary: Negative.  Negative for dysuria, frequency and urgency.  Musculoskeletal: Negative.  Negative for  back pain, myalgias and neck pain.  Skin:  Negative for itching and rash.  Neurological: Negative.  Negative for dizziness, tingling, tremors, weakness and headaches.  Endo/Heme/Allergies: Negative.  Negative for environmental allergies and polydipsia. Does not bruise/bleed easily.  Psychiatric/Behavioral:  Positive for depression. Negative for hallucinations, substance abuse and suicidal ideas. The patient does not have insomnia.           Objective:     BP 133/84 (BP Location: Right Arm, Patient Position: Sitting, Cuff Size: Large)   Pulse (!) 102   Ht 5\' 10"  (1.778 m)   Wt 282 lb (127.9 kg)   SpO2 94%   BMI 40.46 kg/m    Physical Exam Constitutional:      General: He is not in acute  distress.    Appearance: He is obese. He is not ill-appearing, toxic-appearing or diaphoretic.  HENT:     Head: Normocephalic and atraumatic.     Right Ear: Tympanic membrane, ear canal and external ear normal. There is no impacted cerumen.     Left Ear: Tympanic membrane, ear canal and external ear normal. There is no impacted cerumen.     Nose: Nose normal. No congestion or rhinorrhea.     Mouth/Throat:     Mouth: Mucous membranes are moist.     Pharynx: No oropharyngeal exudate or posterior oropharyngeal erythema.  Eyes:     General: No scleral icterus.       Right eye: No discharge.        Left eye: No discharge.     Extraocular Movements: Extraocular movements intact.     Conjunctiva/sclera: Conjunctivae normal.     Pupils: Pupils are equal, round, and reactive to light.  Neck:     Vascular: No carotid bruit.  Cardiovascular:     Rate and Rhythm: Normal rate and regular rhythm.     Pulses: Normal pulses.     Heart sounds: Normal heart sounds. No murmur heard.    No friction rub. No gallop.  Pulmonary:     Effort: Pulmonary effort is normal. No respiratory distress.     Breath sounds: Normal breath sounds. No stridor. No wheezing, rhonchi or rales.  Chest:     Chest wall: No tenderness.  Abdominal:     General: There is no distension.     Palpations: Abdomen is soft. There is no mass.     Tenderness: There is no abdominal tenderness. There is no right CVA tenderness, left CVA tenderness, guarding or rebound.     Hernia: No hernia is present.  Musculoskeletal:        General: No swelling, tenderness, deformity or signs of injury.     Cervical back: Normal range of motion and neck supple. No rigidity or tenderness.     Right lower leg: No edema.     Left lower leg: No edema.  Lymphadenopathy:     Cervical: No cervical adenopathy.  Skin:    General: Skin is warm and dry.     Capillary Refill: Capillary refill takes less than 2 seconds.     Coloration: Skin is not  jaundiced or pale.     Findings: No bruising, erythema, lesion or rash.  Neurological:     Mental Status: He is alert and oriented to person, place, and time.     Cranial Nerves: No cranial nerve deficit.     Sensory: No sensory deficit.     Motor: No weakness.     Coordination: Coordination normal.  Gait: Gait normal.     Deep Tendon Reflexes: Reflexes normal.  Psychiatric:        Behavior: Behavior normal.        Thought Content: Thought content normal.        Judgment: Judgment normal.     Comments: Flat affect      No results found for any visits on 09/19/21.     Assessment & Plan:    Routine Health Maintenance and Physical Exam  Immunization History  Administered Date(s) Administered   Influenza,inj,Quad PF,6+ Mos 12/07/2017, 12/14/2018   Influenza-Unspecified 11/21/2020    Health Maintenance  Topic Date Due   COVID-19 Vaccine (1) Never done   HPV VACCINES (1 - Male 2-dose series) Never done   Hepatitis C Screening  Never done   TETANUS/TDAP  Never done   INFLUENZA VACCINE  09/18/2021   HIV Screening  11/13/2021 (Originally 12/03/2014)    Discussed health benefits of physical activity, and encouraged him to engage in regular exercise appropriate for his age and condition.  Problem List Items Addressed This Visit       Cardiovascular and Mediastinum   Hypertension    BP Readings from Last 3 Encounters:  09/19/21 133/84  06/26/21 111/77  06/06/21 138/88  Chronic condition well-controlled on lisinopril 10 mg daily, clonidine 0.2 mg daily at bedtime Continue current medications DASH diet advised engage in regular vigorous exercise at least 150 minutes weekly Follow-up in 6 months        Other   Anxiety and depression    PHQ-9 score 8, denies SI, HI Needs referral to new  psych , referral sent to CCM today Continue Wellbutrin 300 mg daily, buspirone 10 mg twice daily, clonidine 0.2 mg daily at bedtime, Vistaril 25 mg every 6 hours as needed, Seroquel  50 mg daily      Relevant Orders   AMB Referral to [facility] Coordinaton   ADHD    Please refer to initial psychiatric Referral sent to CCM today      Relevant Orders   AMB Referral to [facility] Coordinaton   Morbid obesity (HCC)    Wt Readings from Last 3 Encounters:  09/19/21 282 lb (127.9 kg)  06/26/21 290 lb (131.5 kg)  06/06/21 296 lb (134.3 kg)  Need to increase intake of whole food consisting mainly vegetables and protein and less carbohydrates engaging in regular vigorous exercise at least 150 minutes weekly, drinking at least 64 ounces of water daily instead of juice, importance of portion control also discussed benefits of healthy weight discussed with patient      Annual physical exam - Primary    Annual exam as documented.  Counseling done include healthy lifestyle involving committing to 150 minutes of exercise per week, heart healthy diet, and attaining healthy weight. The importance of adequate sleep also discussed.  Regular use of seat belt and home safety were also discussed . Changes in health habits are decided on by patient with goals and time frames set for achieving them. Immunization   needs are specifically addressed at this visit.  Tdap and HPV vaccines need for both vaccines discussed he was encouraged to get the vaccines at his pharmacy      Relevant Orders   Hepatitis C Antibody   Lipid Profile   TSH   Other Visit Diagnoses     Microcytosis       Relevant Orders   CBC      Return in about 6 months (around 03/22/2022)  for HTN.     Donell Beers, FNP

## 2021-09-19 NOTE — Assessment & Plan Note (Signed)
Lab Results  Component Value Date   WBC 12.1 (H) 06/06/2021   HGB 14.2 06/06/2021   HCT 45.3 06/06/2021   MCV 79 06/06/2021   PLT 236 06/06/2021   Last CBC Lab Results  Component Value Date   WBC 12.1 (H) 06/06/2021   HGB 14.2 06/06/2021   HCT 45.3 06/06/2021   MCV 79 06/06/2021   MCH 24.7 (L) 06/06/2021   RDW 14.5 06/06/2021   PLT 236 06/06/2021

## 2021-09-19 NOTE — Assessment & Plan Note (Addendum)
Wt Readings from Last 3 Encounters:  09/19/21 282 lb (127.9 kg)  06/26/21 290 lb (131.5 kg)  06/06/21 296 lb (134.3 kg)  Need to increase intake of whole food consisting mainly vegetables and protein and less carbohydrates engaging in regular vigorous exercise at least 150 minutes weekly, drinking at least 64 ounces of water daily instead of juice, importance of portion control also discussed benefits of healthy weight discussed with patient

## 2021-09-19 NOTE — Assessment & Plan Note (Signed)
Please refer to initial psychiatric Referral sent to CCM today

## 2021-09-19 NOTE — Assessment & Plan Note (Signed)
PHQ-9 score 8, denies SI, HI Needs referral to new  psych , referral sent to CCM today Continue Wellbutrin 300 mg daily, buspirone 10 mg twice daily, clonidine 0.2 mg daily at bedtime, Vistaril 25 mg every 6 hours as needed, Seroquel 50 mg daily

## 2021-09-21 LAB — TSH: TSH: 2.32 u[IU]/mL (ref 0.450–4.500)

## 2021-09-21 LAB — CBC
Hematocrit: 45.7 % (ref 37.5–51.0)
Hemoglobin: 14.6 g/dL (ref 13.0–17.7)
MCH: 25.3 pg — ABNORMAL LOW (ref 26.6–33.0)
MCHC: 31.9 g/dL (ref 31.5–35.7)
MCV: 79 fL (ref 79–97)
Platelets: 209 10*3/uL (ref 150–450)
RBC: 5.76 x10E6/uL (ref 4.14–5.80)
RDW: 14.3 % (ref 11.6–15.4)
WBC: 9.1 10*3/uL (ref 3.4–10.8)

## 2021-09-21 LAB — LIPID PANEL
Chol/HDL Ratio: 4.2 ratio (ref 0.0–5.0)
Cholesterol, Total: 148 mg/dL (ref 100–199)
HDL: 35 mg/dL — ABNORMAL LOW (ref >39)
LDL Chol Calc (NIH): 95 mg/dL (ref 0–99)
Triglycerides: 98 mg/dL (ref 0–149)
VLDL Cholesterol Cal: 18 mg/dL (ref 5–40)

## 2021-09-21 LAB — HEPATITIS C ANTIBODY: Hep C Virus Ab: NONREACTIVE

## 2021-09-21 NOTE — Progress Notes (Signed)
Labs are stable.  Patient should engage in regular moderate to vigorous exercises at least 150 minutes weekly as discussed. Eat low card diet.

## 2021-10-08 ENCOUNTER — Telehealth: Payer: Self-pay

## 2021-10-08 NOTE — Telephone Encounter (Signed)
Patient mother [name] has not heard from behavior treatment, please return call to [name] [phone]

## 2021-10-09 NOTE — Telephone Encounter (Signed)
Spoke with pt

## 2021-10-09 NOTE — Telephone Encounter (Signed)
Called [name] pt's mother no answer left vm

## 2021-10-17 ENCOUNTER — Other Ambulatory Visit: Payer: Self-pay

## 2021-10-17 ENCOUNTER — Telehealth: Payer: Self-pay | Admitting: *Deleted

## 2021-10-17 DIAGNOSIS — F419 Anxiety disorder, unspecified: Secondary | ICD-10-CM

## 2021-10-17 NOTE — Chronic Care Management (AMB) (Signed)
  Chronic Care Management   Note  10/17/2021 Name: Joshua Sanchez MRN: [id number] DOB: [birth year]  Joshua Sanchez is a 21 y.o. year old male who is a primary care patient of Alvira Monday, [place]. I reached out to Joshua Sanchez by phone today in response to a referral sent by Joshua Sanchez PCP.  Joshua Sanchez was given information about Chronic Care Management services today including:  CCM service includes personalized support from designated clinical staff supervised by his physician, including individualized plan of care and coordination with other care providers 24/7 contact phone numbers for assistance for urgent and routine care needs. Service will only be billed when office clinical staff spend 20 minutes or more in a month to coordinate care. Only one practitioner may furnish and bill the service in a calendar month. The patient may stop CCM services at any time (effective at the end of the month) by phone call to the office staff. The patient is responsible for co-pay (up to 20% after annual deductible is met) if co-pay is required by the individual health plan.   Mother Joshua Sanchez DPR on file  verbally agreed to assistance and services provided by embedded care coordination/care management team today.  Follow up plan: Telephone appointment with care management team member scheduled for:10/24/21  [place]: [phone]

## 2021-10-24 ENCOUNTER — Telehealth: Payer: Self-pay

## 2021-10-24 ENCOUNTER — Encounter: Payer: Medicare Other | Admitting: Licensed Clinical Social Worker

## 2021-10-25 ENCOUNTER — Ambulatory Visit: Payer: Self-pay | Admitting: Licensed Clinical Social Worker

## 2021-10-25 NOTE — Patient Outreach (Signed)
  Care Coordination   Initial Visit Note   10/25/2021 Name: Joshua Sanchez MRN: [id number] DOB: [birth year]  Joshua Sanchez is a 21 y.o. year old male who sees Gilmore Laroche, FNP for primary care. I spoke with  [name] by phone today.  What matters to the patients health and wellness today?  Finding a new mental health provider ; increasing his activity Patient was accompanied by mother who provided information during this encounter. Patient previously follow by [facility], they are no longer able to provide services due to his age  .   Recommendation: Patient may benefit from, and is in agreement to To walk a little each day and increase as he becomes comfortable .  Will follow up with [facility] in [place] if no one calls within the next week.   Goals Addressed             This Visit's Progress    Connect with a Mental Health provider       Care Coordination Interventions: Solution-Focused Strategies employed:  Active listening / Reflection utilized  Behavioral Activation reviewed Problem Solving /Task Center strategies reviewed Reviewed mental health medications and discussed importance of compliance: reports no concerns has 30 days supply Collaborated with [facility] [phone] on referral placed by provider Discussed goal of becoming more active ( you agreed to walk more)          SDOH assessments and interventions completed:  Yes  SDOH Interventions Today    Flowsheet Row Most Recent Value  SDOH Interventions   Food Insecurity Interventions Intervention Not Indicated  Housing Interventions Intervention Not Indicated  Transportation Interventions Intervention Not Indicated  Utilities Interventions Intervention Not Indicated  Physical Activity Interventions Intervention Not Indicated        Care Coordination Interventions Activated:  Yes  Care Coordination Interventions:  Yes, provided   Follow up plan: Follow up  call scheduled for 11/22/21    Encounter Outcome:  Pt. Visit Completed   Sammuel Hines, [facility] Care Coordination  [facility] [phone]

## 2021-10-25 NOTE — Patient Instructions (Signed)
Visit Information  Thank you for taking time to visit with me today. Please don't hesitate to contact me if I can be of assistance to you.   Following are the goals we discussed today:   Goals Addressed             This Visit's Progress    Connect with a Mental Health provider       Care Coordination Interventions: Solution-Focused Strategies employed:  Active listening / Reflection utilized  Behavioral Activation reviewed Problem Solving /Task Center strategies reviewed Reviewed mental health medications and discussed importance of compliance: reports no concerns has 30 days supply Collaborated with [facility] [phone] on referral placed by provider Discussed goal of becoming more active ( you agreed to walk more)          Our next appointment is by telephone on 11/22/21 at 8:30  Please call the care guide team at [phone] if you need to cancel or reschedule your appointment.   If you are experiencing a Mental Health or Behavioral Health Crisis or need someone to talk to, please call the [facility]: [phone]   The patient verbalized understanding of instructions, educational materials, and care plan provided today and DECLINED offer to receive copy of patient instructions, educational materials, and care plan.   Joshua Hines, LCSW Care Coordination  [facility] [phone]

## 2021-11-07 ENCOUNTER — Ambulatory Visit: Payer: Self-pay | Admitting: Licensed Clinical Social Worker

## 2021-11-07 NOTE — Patient Outreach (Signed)
  Care Coordination   Follow Up Visit Note   11/07/2021 Name: Camdon Saetern MRN: [id number] DOB: 05/25/2000  Keegan Ducey is a 21 y.o. year old male who sees Doren Custard, Hazle Nordmann, MD for primary care.   What matters to the patients health and wellness today?    Collaboration  reference care coordination for [place] [facility]. Patient was not interviewed or contacted during this encounter.  Intervention:Conducted brief assessment, recommendations and relevant information discussed.  Collaborated with [place] via phone and in-basket message ref. Referral placed by PCP 10/17/21  to assist with meeting patient's needs.      SDOH assessments and interventions completed:  No     Care Coordination Interventions Activated:  Yes  Care Coordination Interventions:  Yes, provided   Follow up plan: Will wait for return call from [facility] refer referral;   Follow up call scheduled for 11/22/21  with patient  Encounter Outcome:  Pt. Visit Completed   Casimer Lanius, [place] [phone]

## 2021-11-07 NOTE — Patient Instructions (Signed)
    Patient was not contacted during this encounter.  LCSW collaborated with care team to accomplish patient's care plan goal   Asaiah Scarber, LCSW Social Work Care Coordination  [facility] [phone]  

## 2021-11-15 ENCOUNTER — Encounter (HOSPITAL_COMMUNITY): Payer: Self-pay | Admitting: Psychiatry

## 2021-11-15 ENCOUNTER — Telehealth (INDEPENDENT_AMBULATORY_CARE_PROVIDER_SITE_OTHER): Payer: Medicare Other | Admitting: Psychiatry

## 2021-11-15 DIAGNOSIS — F908 Attention-deficit hyperactivity disorder, other type: Secondary | ICD-10-CM | POA: Diagnosis not present

## 2021-11-15 DIAGNOSIS — F840 Autistic disorder: Secondary | ICD-10-CM | POA: Diagnosis not present

## 2021-11-15 DIAGNOSIS — F3340 Major depressive disorder, recurrent, in remission, unspecified: Secondary | ICD-10-CM | POA: Insufficient documentation

## 2021-11-15 DIAGNOSIS — R569 Unspecified convulsions: Secondary | ICD-10-CM | POA: Diagnosis not present

## 2021-11-15 DIAGNOSIS — F411 Generalized anxiety disorder: Secondary | ICD-10-CM | POA: Insufficient documentation

## 2021-11-15 DIAGNOSIS — F330 Major depressive disorder, recurrent, mild: Secondary | ICD-10-CM

## 2021-11-15 MED ORDER — BUSPIRONE HCL 10 MG PO TABS: 10.0000 mg | ORAL_TABLET | Freq: Two times a day (BID) | ORAL | 0 refills | Status: AC

## 2021-11-15 MED ORDER — CLONIDINE HCL 0.2 MG PO TABS: 0.2000 mg | ORAL_TABLET | Freq: Every day | ORAL | 0 refills | Status: DC

## 2021-11-15 MED ORDER — HYDROXYZINE PAMOATE 25 MG PO CAPS: 25.0000 mg | ORAL_CAPSULE | Freq: Two times a day (BID) | ORAL | 0 refills | Status: AC | PRN

## 2021-11-15 MED ORDER — BUPROPION HCL ER (XL) 300 MG PO TB24: 300.0000 mg | ORAL_TABLET | Freq: Every morning | ORAL | 0 refills | Status: DC

## 2021-11-15 NOTE — Patient Instructions (Addendum)
We didn't make any changes to your medication today and refills have been sent in. If you do find the paperwork from Eh's prior testing, if you could come to our [place] office for [place] to make copies to keep in his file that would be helpful. Dr. Nehemiah Settle will reach out to discuss wellbutrin lowering seizure threshold with your neurologist.

## 2021-11-15 NOTE — Progress Notes (Signed)
Psychiatric Initial Adult Assessment  Patient Identification: Joshua Sanchez MRN:  [id number] Date of Evaluation:  11/15/2021 Referral Source: PCP  Assessment:  Joshua Sanchez is a 21 y.o. 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  Patient reports overall good mood with few symptoms of depression which appears to be well managed on his current dose of wellbutrin. Similarly, his generalized anxiety appears to be fairly well controlled on combination of wellbutrin, buspar, and hydroxyzine as needed. He has had formal testing for autism and ADHD after he was adopted at the age of 33 and in interaction with [facility], would concur with these diagnosis. Combination of clonidine and wellbutrin for this is generally indicated and seems to be working well. His adoptive mother, Miss Joshua Sanchez, was present for much of the interview and able to fill in gaps around his history; including reason for visit today. His main psychiatric symptom is lower frustration tolerance around social interactions and some rigidity of behaviors. They both concur that current dosages of all medications have been helpful for him and prefer to continue as is. Did discuss with patient and parent that wellbutrin has a propensity to lower the seizure threshold so will plan on discussing with neurology.   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 -- will discuss wellbutrin use  Patient was given contact information for behavioral health clinic and was instructed to call 911 for emergencies.   Subjective:  Chief Complaint:  Chief Complaint  Patient presents with   Establish Care    History of Present Illness:  Not sure if there is anything MD can help with. Feeling good overall, just needs help getting his medication. Adoptive mother present and provides history (Miss Joshua Sanchez). Takes hydroxyzine, bupropion, buspar, and no longer on seroquel. Thinks medication has been helpful. Enjoys working on cars but not employed or going to school at the moment. Keppra increased to 1500mg  bid after April seizures. She adds that he can get upset easily, typically with too many questions. Anger historically an issue but all of this is a lot better since on current medication. Spends much of his time on the computer interacting with others. Short attention span with quick boredom. Finds that dog is the main thing that keeps him calm. Has noticed some separation  Joshua Sanchez when alone clarifies that arguing is more friendly banter with friends. Can get angry but very dependent on certain activities which are hard to identify. Either he doesn't sleep or oversleeps. Bed around 1a or 2a or 5a, wakes anywhere from 10a to afternoon. Sometimes feels rested. Motivation variable. Can focus when enjoying activity. Appetite at baseline, not always hungry and can forget to eat. Isolated binge episodes when younger. No  purging or restricting. No excess guilt. Fidgety. Recently no SI, in the past had some hopelessness about having anything to do  in life. Never had a plan. Never had any intent due to love for family and friends. Would talk to mother or suicide hotline if came back.   Worries a lot of the time. Mainly around what he will do in a few years, mom, and friends. Used to have panic attacks and thinks medicine has been helpful; last was at work years ago. Longest period of sleeplessness is 3-4 days and feels tired. Had started projects without finishing them but is more chronic. No excess spending no hypersexuality. On disability due to seizures. Used to hear his name being called all the time, still does from time to time. In high school felt like people stared at him, no paranoia. No known trauma.   No alcohol at present, one alcohol unit at a time when he does. No nicotine products. No drugs.    Past Psychiatric History:  Diagnoses: autism, ADHD, anxiety, depression, seizures with last in April Medication trials: yes but current medication is effective for calm. Without it difficult. Ritalin made angry and hyperactive and didn't eat. Prozac like zombie, sertraline sounds familiar.  Previous psychiatrist/therapist: yes since kindergarten and worked closely with [facility]. Hospitalizations: none Suicide attempts: none SIB: none Hx of violence towards others: in defense or when standing too close Current access to guns: none Hx of abuse: none that he is aware of  Previous Psychotropic Medications: Yes   Substance Abuse History in the last 12 months:  No.  Past Medical History:  Past Medical History:  Diagnosis Date   ADHD (attention deficit hyperactivity disorder)    Anxiety and depression 11/13/2020   Autism    OCD (obsessive compulsive disorder)    Seizures (HCC)    focal   Separation anxiety     Past Surgical History:  Procedure Laterality Date   TONSILLECTOMY      Family Psychiatric History: adopted  Family History:  Family History  Adopted: Yes  Problem Relation Age of Onset   Cancer Neg Hx     Clotting disorder Neg Hx    Rheumatologic disease Neg Hx     Social History:   Social History   Socioeconomic History   Marital status: Single    Spouse name: Not on file   Number of children: Not on file   Years of education: Not on file   Highest education level: Not on file  Occupational History   Not on file  Tobacco Use   Smoking status: Never   Smokeless tobacco: Never  Vaping Use   Vaping Use: Never used  Substance and Sexual Activity   Alcohol use: Yes    Alcohol/week: 1.0 standard drink of alcohol    Types: 1 Standard drinks or equivalent per week    Comment: infrequent socially   Drug use: Never   Sexual activity: Never  Other Topics Concern   Not on file  Social History Narrative   Right handed      Highest level of edu- 12th grade      Lives with mom   One story home 5 steps into home   Caff. sodas   Social Determinants of Health   Financial Resource Strain: Low Risk  (12/14/2020)   Overall Financial Resource Strain (CARDIA)    Difficulty of Paying Living Expenses: Not hard at all  Food Insecurity: No Food Insecurity (10/25/2021)   Hunger Vital  Sign    Worried About Programme researcher, broadcasting/film/video in the Last Year: Never true    Ran Out of Food in the Last Year: Never true  Transportation Needs: No Transportation Needs (10/25/2021)   PRAPARE - Administrator, Civil Service (Medical): No    Lack of Transportation (Non-Medical): No  Physical Activity: Insufficiently Active (10/25/2021)   Exercise Vital Sign    Days of Exercise per Week: 1 day    Minutes of Exercise per Session: 20 min  Stress: No Stress Concern Present (12/14/2020)   [employer] of Occupational Health - Occupational Stress Questionnaire    Feeling of Stress : Not at all  Social Connections: Socially Isolated (12/14/2020)   Social Connection and Isolation Panel [NHANES]    Frequency of Communication with Friends and Family: More than three times a week    Frequency of Social  Gatherings with Friends and Family: More than three times a week    Attends Religious Services: Never    Database administrator or Organizations: No    Attends Engineer, structural: Never    Marital Status: Never married    Additional Social History: see HPI  Allergies:  No Known Allergies  Current Medications: Current Outpatient Medications  Medication Sig Dispense Refill   acetaminophen (TYLENOL) 500 MG tablet Take 1 tablet (500 mg total) by mouth every 6 (six) hours as needed. 30 tablet 0   buPROPion (WELLBUTRIN XL) 300 MG 24 hr tablet Take 1 tablet (300 mg total) by mouth every morning. 30 tablet 0   busPIRone (BUSPAR) 10 MG tablet Take 1 tablet (10 mg total) by mouth 2 (two) times daily. 60 tablet 0   cloNIDine (CATAPRES) 0.2 MG tablet Take 1 tablet (0.2 mg total) by mouth at bedtime. 30 tablet 0   hydrOXYzine (VISTARIL) 25 MG capsule Take 1 capsule (25 mg total) by mouth 2 (two) times daily as needed for anxiety or itching. 60 capsule 0   ibuprofen (ADVIL) 600 MG tablet Take 1 tablet (600 mg total) by mouth every 8 (eight) hours as needed. (Patient not taking: Reported on 06/06/2021) 30 tablet 0   levETIRAcetam (KEPPRA) 1000 MG tablet TAKE 1 TABLET(1000 MG) BY MOUTH TWICE DAILY. 60 tablet 2   lisinopril (ZESTRIL) 10 MG tablet TAKE 1 TABLET BY MOUTH EVERY DAY FOR BLOOD PRESSURE 90 tablet 2   loratadine (CLARITIN) 10 MG tablet TAKE 1 TABLET BY MOUTH EVERY DAY FOR ALLERGIES 90 tablet 0   No current facility-administered medications for this visit.    ROS: Review of Systems  Constitutional:  Negative for unexpected weight change.  Gastrointestinal:  Negative for constipation and diarrhea.  Neurological:  Negative for dizziness and headaches.  Psychiatric/Behavioral:  Positive for sleep disturbance. Negative for decreased concentration, dysphoric mood and suicidal ideas. The patient is nervous/anxious.     Objective:  Psychiatric Specialty Exam: There were no vitals  taken for this visit.There is no height or weight on file to calculate BMI.  General Appearance: Casual, Fairly Groomed, and wearing glasses  Eye Contact:  Fair  Speech:   clear and coherent. Overall short sentence structure with slight hesitation before answers  Volume:  Normal  Mood:   "good"  Affect:  Congruent and decreased range. Overall calm and cooperative  Thought Content: Logical and Hallucinations: None   Suicidal Thoughts:  No  Homicidal Thoughts:  No  Thought Process:  Concrete  Orientation:  Full (Time, Place, and Person)    Memory:  Immediate;   Fair Recent;   Fair Remote;   Fair  Judgment:  Fair  Insight:  Fair  Concentration:  Concentration: Fair and Attention Span: Fair  Recall:  [employer] of Knowledge: Fair  Language: Fair  Psychomotor Activity:  Psychomotor Retardation  Akathisia:  No  AIMS (if indicated): not done  Assets:  Communication Skills Desire for Improvement Financial Resources/Insurance Housing Leisure Time Physical Health Resilience Social Support Talents/Skills  ADL's:  Intact  Cognition: WNL  Sleep:  Fair   PE: General: sits comfortably in view of camera; no acute distress. Interacts with mother and dog well Pulm: no increased work of breathing on room air  MSK: all extremity movements appear intact  Neuro: no focal neurological deficits observed  Gait & Station: unable to assess by video    Metabolic Disorder Labs: No results found for: "HGBA1C", "MPG" No results found for: "PROLACTIN" Lab Results  Component Value Date   CHOL 148 09/20/2021   TRIG 98 09/20/2021   HDL 35 (L) 09/20/2021   CHOLHDL 4.2 09/20/2021   [place] 95 09/20/2021   Lab Results  Component Value Date   TSH 2.320 09/20/2021    Therapeutic Level Labs: No results found for: "LITHIUM" No results found for: "CBMZ" No results found for: "VALPROATE"  Screenings:  PHQ2-9    Flowsheet Row Video Visit from 11/15/2021 in [facility] Office Visit from 09/19/2021 in [facility] Office Visit from 06/06/2021 in [facility] Office Visit from 03/01/2021 in [facility] from 12/14/2020 in [facility]  PHQ-2 Total Score 0 3 6 0 0  PHQ-9 Total Score -- 8 21 -- 0      Flowsheet Row Video Visit from 11/15/2021 in [facility] from 09/21/2020 in [facility] [place] [facility] from 07/31/2020 in [facility] [place] No Risk No Risk No Risk       Collaboration of Care: Collaboration of Care: Medication Management [place] neurology regarding keppra and wellbutrin  Patient/Guardian was advised Release of Information must be obtained prior to any record release in order to collaborate their care with an outside provider. Patient/Guardian was advised if they have not already done so to contact the registration department to sign all necessary forms in order for [place] to release information regarding their care.   Consent: Patient/Guardian gives verbal consent for treatment and assignment of benefits for services provided during this visit. Patient/Guardian expressed understanding and agreed to proceed.   Televisit via video: I connected with Wilhemena Durie on 11/15/21 at 10:00 AM EDT by a video enabled telemedicine application and verified that I am speaking with the correct person using two identifiers.  Location: Patient: home in [place] Provider: home office   I discussed the limitations of evaluation and management by telemedicine and the availability of in person appointments. The patient expressed understanding and agreed to proceed.  I discussed the assessment and treatment plan with the patient. The patient was provided an opportunity to ask questions and all were answered. The patient agreed with the plan and demonstrated an understanding of the instructions.   The  patient was advised to call back or seek an in-person evaluation if the symptoms worsen or if the condition fails to improve as anticipated.  I provided 60 minutes of non-face-to-face time during this encounter.  Jacquelynn Cree, MD 9/28/202312:55 PM

## 2021-11-22 ENCOUNTER — Ambulatory Visit: Payer: Self-pay | Admitting: Licensed Clinical Social Worker

## 2021-11-22 NOTE — Patient Instructions (Signed)
Visit Information  Thank you for taking time to visit with me today. Please don't hesitate to contact me if I can be of assistance to you.   Following are the goals we discussed today:   Goals Addressed             This Visit's Progress    Connect with Mental Health provider       Care Coordination Interventions: Solution-Focused Strategies employed:  Active listening / Reflection utilized  Problem [place] strategies reviewed Participation in counseling encouraged  Discussed referral options to connect for ongoing therapy: Shmuel will Call [facility] to schedule an appointment Discussed referral for psychiatry: has initial visit last week , next appointment in Oct. Assisted with setting up MyChart access        Our next appointment is by telephone on 12/24/21 at 8:30  Please call the care guide team at [phone] if you need to cancel or reschedule your appointment.   If you are experiencing a Mental Health or [place] or need someone to talk to, please call the [place] National Suicide Prevention Lifeline: 930 154 0599 or TTY: 680 588 9230 TTY 4157587105) to talk to a trained counselor call 1-800-273-TALK (toll free, 24 hour hotline) call the [facility]: [phone]   Patient verbalizes understanding of instructions and care plan provided today and agrees to view in [place]. Active MyChart status and patient understanding of how to access instructions and care plan via MyChart confirmed with patient.     Casimer Lanius, [place] [phone]

## 2021-11-22 NOTE — Patient Outreach (Signed)
  Care Coordination   Follow Up Visit Note   11/22/2021 Name: Joshua Sanchez MRN: [id number] DOB: June 07, 2000  Joshua Sanchez is a 21 y.o. year old male who sees Doren Custard, Hazle Nordmann, MD for primary care. I spoke with  Joshua Sanchez by phone today.  What matters to the patients health and wellness today?   Managing his mental health Patient was accompanied by his mother who provided information during this encounter..   Recommendation: Patient may benefit from, and is in agreement to Call to schedule a therapy appointment.  Set up his Mychart access   Goals Addressed             This Visit's Progress    Connect with Mental Health provider       Care Coordination Interventions: Solution-Focused Strategies employed:  Active listening / Reflection utilized  Problem Joshua Sanchez strategies reviewed Participation in counseling encouraged  Discussed referral options to connect for ongoing therapy: Joshua Sanchez will Call [place] to schedule an appointment Discussed referral for psychiatry: has initial visit last week , next appointment in Oct. Assisted with setting up MyChart access        SDOH assessments and interventions completed:  No    Care Coordination Interventions Activated:  Yes  Care Coordination Interventions:  Yes, provided   Follow up plan: Follow up call scheduled for 12/24/21    Encounter Outcome:  Pt. Visit Completed   Casimer Lanius, [place] [phone]

## 2021-11-26 ENCOUNTER — Ambulatory Visit: Payer: Self-pay | Admitting: Licensed Clinical Social Worker

## 2021-11-26 NOTE — Patient Outreach (Signed)
  Care Coordination   Follow Up Visit Note   11/26/2021 Name: Joshua Sanchez MRN: [id number] DOB: 04/05/2000  Joshua Sanchez is a 21 y.o. year old male who sees Doren Custard, Hazle Nordmann, MD for primary care. I spoke with  Joshua Sanchez 's mother by phone today.  What matters to the patients health and wellness today?  Discussed barriers with connecting for counseling    Goals Addressed             This Visit's Progress    Connect with Mental Health provider for counseling       Care Coordination Interventions: Solution-Focused Strategies employed:  Active listening / Reflection utilized  Problem [place] strategies reviewed Participation in counseling encouraged  Discussed referral options to connect for ongoing therapy: Barriers with scheduling an appointment with [place] Provided phone number        SDOH assessments and interventions completed:  No   Care Coordination Interventions Activated:  Yes  Care Coordination Interventions:  Yes, provided   Follow up plan: Follow up call scheduled for 3 weeks    Encounter Outcome:  Pt. Visit Completed   Casimer Lanius, [place] [phone]

## 2021-11-26 NOTE — Patient Instructions (Signed)
Visit Information  Thank you for taking time to visit with me today. Please don't hesitate to contact me if I can be of assistance to you.   Following are the goals we discussed today:   Goals Addressed             This Visit's Progress    Connect with Mental Health provider for counseling       Care Coordination Interventions: Solution-Focused Strategies employed:  Active listening / Reflection utilized  Problem [place] strategies reviewed Participation in counseling encouraged  Discussed referral options to connect for ongoing therapy: Barriers with scheduling an appointment with [place] Provided phone number        Our next appointment is by telephone on 12/24/21 at 8:30  Please call the care guide team at [phone] if you need to cancel or reschedule your appointment.   If you are experiencing a Mental Health or [place] or need someone to talk to, please call the [facility]: [phone]   Patient verbalizes understanding of instructions and care plan provided today and agrees to view in [place]. Active MyChart status and patient understanding of how to access instructions and care plan via MyChart confirmed with patient.      Casimer Lanius, [place] [phone]

## 2021-12-17 ENCOUNTER — Telehealth (INDEPENDENT_AMBULATORY_CARE_PROVIDER_SITE_OTHER): Payer: Medicare Other | Admitting: Psychiatry

## 2021-12-17 ENCOUNTER — Encounter: Payer: Self-pay | Admitting: Internal Medicine

## 2021-12-17 ENCOUNTER — Ambulatory Visit (INDEPENDENT_AMBULATORY_CARE_PROVIDER_SITE_OTHER): Payer: Medicare Other | Admitting: Internal Medicine

## 2021-12-17 DIAGNOSIS — F840 Autistic disorder: Secondary | ICD-10-CM

## 2021-12-17 DIAGNOSIS — F908 Attention-deficit hyperactivity disorder, other type: Secondary | ICD-10-CM | POA: Diagnosis not present

## 2021-12-17 DIAGNOSIS — Z0000 Encounter for general adult medical examination without abnormal findings: Secondary | ICD-10-CM

## 2021-12-17 DIAGNOSIS — F411 Generalized anxiety disorder: Secondary | ICD-10-CM

## 2021-12-17 DIAGNOSIS — F330 Major depressive disorder, recurrent, mild: Secondary | ICD-10-CM

## 2021-12-17 MED ORDER — BUSPIRONE HCL 10 MG PO TABS: 10.0000 mg | ORAL_TABLET | Freq: Two times a day (BID) | ORAL | 1 refills | Status: DC

## 2021-12-17 MED ORDER — BUPROPION HCL ER (XL) 300 MG PO TB24: 300.0000 mg | ORAL_TABLET | Freq: Every morning | ORAL | 1 refills | Status: DC

## 2021-12-17 MED ORDER — LORATADINE 10 MG PO TABS: ORAL_TABLET | ORAL | 0 refills | Status: DC

## 2021-12-17 MED ORDER — CLONIDINE HCL 0.2 MG PO TABS: 0.2000 mg | ORAL_TABLET | Freq: Every day | ORAL | 1 refills | Status: DC

## 2021-12-17 NOTE — Progress Notes (Unsigned)
Subjective:   This is a telephone encounter between Joshua Sanchez and Lorene Dy on 12/17/2021 for AWV. The visit was conducted with the patient located at home and Lorene Dy at [facility]. The patient's identity was confirmed using their DOB and current address. The patient has consented to being evaluated through a telephone encounter and understands the associated risks (an examination cannot be done and the patient may need to come in for an appointment) / benefits (allows the patient to remain at home, decreasing exposure to coronavirus).     Joshua Sanchez is a 21 y.o. male who presents for Medicare Annual/Subsequent preventive examination.  Review of Systems    Review of Systems  All other systems reviewed and are negative.    Objective:    There were no vitals filed for this visit. There is no height or weight on file to calculate BMI.     06/26/2021    3:34 PM 12/14/2020   11:27 AM 06/26/2020    3:49 PM 11/01/2018    8:41 AM 05/22/2017   10:47 AM 11/01/2014   10:46 AM 08/26/2014    1:46 PM  Advanced Directives  Does Patient Have a Medical Advance Directive? No No No No No No No  Would patient like information on creating a medical advance directive?  Yes (ED - Information included in AVS)  No - Patient declined No - Patient declined No - patient declined information No - patient declined information    Current Medications (verified) Outpatient Encounter Medications as of 12/17/2021  Medication Sig   acetaminophen (TYLENOL) 500 MG tablet Take 1 tablet (500 mg total) by mouth every 6 (six) hours as needed.   buPROPion (WELLBUTRIN XL) 300 MG 24 hr tablet Take 1 tablet (300 mg total) by mouth every morning.   cloNIDine (CATAPRES) 0.2 MG tablet Take 1 tablet (0.2 mg total) by mouth at bedtime.   levETIRAcetam (KEPPRA) 1000 MG tablet TAKE 1 TABLET(1000 MG) BY MOUTH TWICE DAILY.   lisinopril (ZESTRIL) 10 MG tablet TAKE 1 TABLET BY MOUTH EVERY DAY FOR BLOOD PRESSURE   loratadine  (CLARITIN) 10 MG tablet TAKE 1 TABLET BY MOUTH EVERY DAY FOR ALLERGIES   [DISCONTINUED] loratadine (CLARITIN) 10 MG tablet TAKE 1 TABLET BY MOUTH EVERY DAY FOR ALLERGIES   No facility-administered encounter medications on file as of 12/17/2021.    Allergies (verified) Patient has no known allergies.   History: Past Medical History:  Diagnosis Date   ADHD (attention deficit hyperactivity disorder)    Anxiety and depression 11/13/2020   Autism    OCD (obsessive compulsive disorder)    Seizures (HCC)    focal   Separation anxiety    Past Surgical History:  Procedure Laterality Date   TONSILLECTOMY     Family History  Adopted: Yes  Problem Relation Age of Onset   Cancer Neg Hx    Clotting disorder Neg Hx    Rheumatologic disease Neg Hx    Social History   Socioeconomic History   Marital status: Single    Spouse name: Not on file   Number of children: Not on file   Years of education: Not on file   Highest education level: Not on file  Occupational History   Not on file  Tobacco Use   Smoking status: Never   Smokeless tobacco: Never  Vaping Use   Vaping Use: Never used  Substance and Sexual Activity   Alcohol use: Yes    Alcohol/week: 1.0 standard drink of alcohol  Types: 1 Standard drinks or equivalent per week    Comment: infrequent socially   Drug use: Never   Sexual activity: Never  Other Topics Concern   Not on file  Social History Narrative   Right handed      Highest level of edu- 12th grade      Lives with mom   One story home 5 steps into home   Caff. sodas   Social Determinants of Health   Financial Resource Strain: Low Risk  (12/14/2020)   Overall Financial Resource Strain (CARDIA)    Difficulty of Paying Living Expenses: Not hard at all  Food Insecurity: No Food Insecurity (10/25/2021)   Hunger Vital Sign    Worried About Running Out of Food in the Last Year: Never true    Ran Out of Food in the Last Year: Never true  Transportation  Needs: No Transportation Needs (10/25/2021)   PRAPARE - Hydrologist (Medical): No    Lack of Transportation (Non-Medical): No  Physical Activity: Insufficiently Active (10/25/2021)   Exercise Vital Sign    Days of Exercise per Week: 1 day    Minutes of Exercise per Session: 20 min  Stress: No Stress Concern Present (12/14/2020)   [place]    Feeling of Stress : Not at all  Social Connections: Socially Isolated (12/14/2020)   Social Connection and Isolation Panel [NHANES]    Frequency of Communication with Friends and Family: More than three times a week    Frequency of Social Gatherings with Friends and Family: More than three times a week    Attends Religious Services: Never    Marine scientist or Organizations: No    Attends Music therapist: Never    Marital Status: Never married    Tobacco Counseling Counseling given: Not Answered   Clinical Intake:  Pre-visit preparation completed: Yes  Pain : No/denies pain     Diabetes: No  How often do you need to have someone help you when you read instructions, pamphlets, or other written materials from your doctor or pharmacy?: 1 - Never  Diabetic?No    Activities of Daily Living    12/17/2021    2:46 PM  In your present state of health, do you have any difficulty performing the following activities:  Hearing? 0  Vision? 0  Difficulty concentrating or making decisions? 1  Walking or climbing stairs? 0  Dressing or bathing? 0  Doing errands, shopping? 0    Patient Care Team: Johnette Abraham, MD as PCP - General (Internal Medicine) Cameron Sprang, MD as Consulting Physician (Neurology) Jacquelynn Cree, MD as Consulting Physician (Psychiatry)  Indicate any recent Medical Services you may have received from other than [facility] providers in the past year (date may be approximate).     Assessment:   This is  a routine wellness examination for Joshua Sanchez.  Hearing/Vision screen No results found.  Dietary issues and exercise activities discussed:     Goals Addressed   None    Depression Screen    12/17/2021    2:45 PM 11/15/2021   12:51 PM 09/19/2021    8:08 AM 06/06/2021    9:11 AM 03/01/2021    4:10 PM 12/14/2020   11:27 AM 12/14/2020   11:25 AM  PHQ 2/9 Scores  PHQ - 2 Score 2  3 6  0 0 0  PHQ- 9 Score   8 21  0 0     Information is confidential and restricted. Go to Review Flowsheets to unlock data.    Fall Risk    09/19/2021    8:08 AM 06/26/2021    3:34 PM 06/06/2021    9:11 AM 03/01/2021    4:10 PM 12/14/2020   11:27 AM  Fall Risk   Falls in the past year? 0 0 0 0 0  Number falls in past yr: 0 0 0 0 0  Injury with Fall? 0 0 0 0 0  Risk for fall due to : No Fall Risks  No Fall Risks No Fall Risks No Fall Risks  Follow up Falls evaluation completed  Falls evaluation completed Falls evaluation completed Falls evaluation completed     Cognitive Function:    12/14/2020   11:28 AM  MMSE - Mini Mental State Exam  Not completed: Unable to complete        12/14/2020   11:28 AM  6CIT Screen  What Year? 0 points  What month? 0 points  What time? 0 points  Count back from 20 0 points  Months in reverse 0 points  Repeat phrase 0 points  Total Score 0 points    Immunizations Immunization History  Administered Date(s) Administered   Influenza,inj,Quad PF,6+ Mos 12/07/2017, 12/14/2018   Influenza-Unspecified 11/21/2020   Moderna Sars-Covid-2 Vaccination 07/05/2019, 08/05/2019    TDAP status: Due, Education has been provided regarding the importance of this vaccine. Advised may receive this vaccine at local pharmacy or Health Dept. Aware to provide a copy of the vaccination record if obtained from local pharmacy or Health Dept. Verbalized acceptance and understanding.  Flu Vaccine status: Due, Education has been provided regarding the importance of this vaccine. Advised may  receive this vaccine at local pharmacy or Health Dept. Aware to provide a copy of the vaccination record if obtained from local pharmacy or Health Dept. Verbalized acceptance and understanding.  Pneumococcal vaccine status: Up to date  Covid-19 vaccine status: Information provided on how to obtain vaccines.   Qualifies for Shingles Vaccine? No   Zostavax completed No   Shingrix Completed?: No.    Education has been provided regarding the importance of this vaccine. Patient has been advised to call insurance company to determine out of pocket expense if they have not yet received this vaccine. Advised may also receive vaccine at local pharmacy or Health Dept. Verbalized acceptance and understanding.  Screening Tests Health Maintenance  Topic Date Due   HPV VACCINES (1 - Male 2-dose series) Never done   HIV Screening  Never done   TETANUS/TDAP  Never done   COVID-19 Vaccine (3 - Moderna series) 09/30/2019   INFLUENZA VACCINE  09/18/2021   Medicare Annual Wellness (AWV)  12/14/2021   Hepatitis C Screening  Completed    Health Maintenance  Health Maintenance Due  Topic Date Due   HPV VACCINES (1 - Male 2-dose series) Never done   HIV Screening  Never done   TETANUS/TDAP  Never done   COVID-19 Vaccine (3 - Moderna series) 09/30/2019   INFLUENZA VACCINE  09/18/2021   Medicare Annual Wellness (AWV)  12/14/2021     Additional Screening:  Vision Screening: Recommended annual ophthalmology exams for early detection of glaucoma and other disorders of the eye. Is the patient up to date with their annual eye exam?  Yes  Who is the provider or what is the name of the office in which the patient attends annual eye exams? [facility] If pt is  not established with a provider, would they like to be referred to a provider to establish care? No .   Dental Screening: Recommended annual dental exams for proper oral hygiene  Community Resource Referral / Chronic Care Management: CRR required this  visit?  No   CCM required this visit?  No      Plan:     I have personally reviewed and noted the following in the patient's chart:   Medical and social history Use of alcohol, tobacco or illicit drugs  Current medications and supplements including opioid prescriptions. Patient is not currently taking opioid prescriptions. Functional ability and status Nutritional status Physical activity Advanced directives List of other physicians Hospitalizations, surgeries, and ER visits in previous 12 months Vitals Screenings to include cognitive, depression, and falls Referrals and appointments  In addition, I have reviewed and discussed with patient certain preventive protocols, quality metrics, and best practice recommendations. A written personalized care plan for preventive services as well as general preventive health recommendations were provided to patient.     Lorene Dy, MD   12/17/2021

## 2021-12-17 NOTE — Progress Notes (Signed)
BH MD Outpatient Progress Note  12/17/2021 11:26 AM Joshua Sanchez  MRN:  [id number]  Assessment:  Joshua Sanchez presents for follow-up evaluation. Today, 12/17/21, patient reports overall stability of mood. Due to lack of general structure to his day, will have individual days where he does not sleep and will stay up playing video games. Reviewed general sleep hygiene with patient and his mother. Will also put in psychotherapy referral for supportive psychotherapy for Joshua Sanchez to assist with motivation. Also provided general autism resources in [place] Sanchez and if he is able to get started with these programs will likely have overall improvement to life goals (getting a job) and mood symptoms. Did discuss wellbutrin use with neurology in between visits and they are ok with continuing wellbutrin at current dosage with regard to seizure risk. Follow up in 2 months.  Identifying Information: Joshua Sanchez is a 21 y.o. 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 Initial presentation on 11/15/21, see that note for full case formulation. Mood symptoms noted to be well controlled on combination of wellbutrin, buspar, and hydroxyzine as needed at that time. He has had formal testing for autism and ADHD after he was adopted at the age of 52 and in interaction with Joshua Sanchez, concurred with these diagnosis. His adoptive mother, Joshua Sanchez, was present for much of the interview and able to fill in gaps around his history. His main psychiatric symptom was lower frustration tolerance around social interactions and some rigidity of behaviors.    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  Patient was given contact information for behavioral health clinic and was instructed to call 911 for emergencies.   Subjective:  Chief Complaint:  Chief Complaint  Patient presents with   Autism   ADHD   Depression   Follow-up    Interval History: Doing well this morning, a bit tired. No updates from the last month. Thinks frustration tolerance is a bit better since last visit. Getting upset less quickly. Mostly gets up and plays on his computer. Currently looking for a job, remote work in work. Either sleeping too much or not at all. Last night wasn't tired and didn't fall asleep last night. Eating 2 meals per day and feels like this is enough. Ms. May adds that he does not have current psychotherapist as indicated at last visit. Additionally, that she has been encouraging him to not spend so much of his time playing video games which causes him to have difficulty falling asleep even with medication. Notes he is drifting off to sleep in this appointment. Reviewed autism society [place] resources with patient and Ms. [name] and they will look into these for assistance with job finding.  Visit Diagnosis:    ICD-10-CM   1. Autism  F84.0 cloNIDine (CATAPRES) 0.2 MG tablet    2. Attention deficit hyperactivity disorder (ADHD), other type  F90.8 cloNIDine (CATAPRES) 0.2 MG tablet     buPROPion (WELLBUTRIN XL) 300 MG 24 hr tablet    3. Generalized anxiety disorder  F41.1 buPROPion (WELLBUTRIN XL) 300 MG 24 hr tablet    busPIRone (BUSPAR) 10 MG tablet    4. Mild episode of recurrent major depressive disorder (HCC)  F33.0 buPROPion (WELLBUTRIN XL) 300 MG 24 hr tablet      Past Psychiatric History:  Diagnoses: autism spectrum disorder, ADHD, generalized anxiety, major depression, generalized idopathic epilepsy Medication trials: yes but current medication is effective for calm. Without it difficult. Ritalin made angry and hyperactive and didn't eat. Prozac like zombie, sertraline sounds familiar.  Previous psychiatrist/therapist: yes since kindergarten and worked closely with [facility]. Hospitalizations: none Suicide attempts: none SIB: none Hx of violence towards others: in defense or when standing too close Current access to guns: none Hx of abuse: none that he is aware of Substance use: none  Past Medical History:  Past Medical History:  Diagnosis Date   ADHD (attention deficit hyperactivity disorder)    Anxiety and depression 11/13/2020   Autism    OCD (obsessive compulsive disorder)    Seizures ([place])    focal   Separation anxiety     Past Surgical History:  Procedure Laterality Date   TONSILLECTOMY      Family Psychiatric History: unknown-adopted  Family History:  Family History  Adopted: Yes  Problem Relation Age of Onset   Cancer Neg Hx    Clotting disorder Neg Hx    Rheumatologic disease Neg Hx     Social History:  Social History   Socioeconomic History   Marital status: Single    Spouse name: Not on file   Number of children: Not on file   Years of education: Not on file   Highest education level: Not on file  Occupational History   Not on file  Tobacco Use   Smoking status: Never   Smokeless tobacco: Never  Vaping Use   Vaping Use: Never used  Substance and Sexual Activity   Alcohol use: Yes    Alcohol/week: 1.0 standard  drink of alcohol    Types: 1 Standard drinks or equivalent per week    Comment: infrequent socially   Drug use: Never   Sexual activity: Never  Other Topics Concern   Not on file  Social History Narrative   Right handed      Highest level of edu- 12th grade      Lives with mom   One story home 5 steps into home   Caff. sodas   Social Determinants of Health   Financial Resource Strain: Low Risk  (12/14/2020)   Overall Financial Resource Strain (CARDIA)    Difficulty of Paying Living Expenses: Not hard at all  Food Insecurity: No Food Insecurity (10/25/2021)   Hunger Vital Sign    Worried About Running Out of Food in the Last Year: Never true    Ran Out of Food in the Last Year: Never true  Transportation Needs: No Transportation Needs (10/25/2021)   PRAPARE - Hydrologist (Medical): No    Lack of Transportation (Non-Medical): No  Physical Activity: Insufficiently Active (10/25/2021)   Exercise Vital Sign    Days of Exercise per Week: 1 day    Minutes of Exercise  per Session: 20 min  Stress: No Stress Concern Present (12/14/2020)   [employer] of Occupational Health - Occupational Stress Questionnaire    Feeling of Stress : Not at all  Social Connections: Socially Isolated (12/14/2020)   Social Connection and Isolation Panel [NHANES]    Frequency of Communication with Friends and Family: More than three times a week    Frequency of Social Gatherings with Friends and Family: More than three times a week    Attends Religious Services: Never    Database administrator or Organizations: No    Attends Engineer, structural: Never    Marital Status: Never married    Allergies: No Known Allergies  Current Medications: Current Outpatient Medications  Medication Sig Dispense Refill   busPIRone (BUSPAR) 10 MG tablet Take 1 tablet (10 mg total) by mouth 2 (two) times daily. 60 tablet 1   acetaminophen (TYLENOL) 500 MG tablet Take 1 tablet (500  mg total) by mouth every 6 (six) hours as needed. 30 tablet 0   buPROPion (WELLBUTRIN XL) 300 MG 24 hr tablet Take 1 tablet (300 mg total) by mouth every morning. 30 tablet 1   cloNIDine (CATAPRES) 0.2 MG tablet Take 1 tablet (0.2 mg total) by mouth at bedtime. 30 tablet 1   levETIRAcetam (KEPPRA) 1000 MG tablet TAKE 1 TABLET(1000 MG) BY MOUTH TWICE DAILY. 60 tablet 2   lisinopril (ZESTRIL) 10 MG tablet TAKE 1 TABLET BY MOUTH EVERY DAY FOR BLOOD PRESSURE 90 tablet 2   loratadine (CLARITIN) 10 MG tablet TAKE 1 TABLET BY MOUTH EVERY DAY FOR ALLERGIES 90 tablet 0   No current facility-administered medications for this visit.    ROS: Review of Systems  Constitutional:  Negative for appetite change and unexpected weight change.  Neurological:  Negative for seizures.  Psychiatric/Behavioral:  Positive for decreased concentration and sleep disturbance. Negative for dysphoric mood, hallucinations, self-injury and suicidal ideas. The patient is not nervous/anxious.     Objective:  Psychiatric Specialty Exam: There were no vitals taken for this visit.There is no height or weight on file to calculate BMI.  General Appearance: Casual, Fairly Groomed, and wearing glasses. Appears stated age  Eye Contact:  Minimal  Speech:  Clear and Coherent, Normal Rate, and overall one to two word answers with short sentence structure  Volume:  Normal  Mood:   "fine"  Affect:  Appropriate, Congruent, and Constricted  Thought Content: Logical and Hallucinations: None   Suicidal Thoughts:  No  Homicidal Thoughts:  No  Thought Process:  Goal Directed and concrete  Orientation:  Full (Time, Place, and Person)    Memory:  Immediate;   Poor Recent;   Poor Remote;   Fair  Judgment:  Fair; limited at baseline  Insight:  Fair; limited at baseline  Concentration:  Concentration: Fair and Attention Span: Fair  Recall:  Poor; limited at baseline  [employer] of Knowledge: Fair  Language: Fair  Psychomotor Activity:   Normal  Akathisia:  No  AIMS (if indicated): not done  Assets:  Desire for Improvement Financial Resources/Insurance Housing Leisure Time Physical Health Resilience Social Support Transportation  ADL's:  Intact  Cognition: WNL  Sleep:  Poor   PE: General: sits comfortably in view of camera; no acute distress  Pulm: no increased work of breathing on room air MSK: all extremity movements appear intact  Neuro: no focal neurological deficits observed  Gait & Station: unable to assess by video    Metabolic Disorder Labs: No results found  for: "HGBA1C", "MPG" No results found for: "PROLACTIN" Lab Results  Component Value Date   CHOL 148 09/20/2021   TRIG 98 09/20/2021   HDL 35 (L) 09/20/2021   CHOLHDL 4.2 09/20/2021   LDLCALC 95 09/20/2021   Lab Results  Component Value Date   TSH 2.320 09/20/2021    Therapeutic Level Labs: No results found for: "LITHIUM" No results found for: "VALPROATE" No results found for: "CBMZ"  Screenings:  PHQ2-9    Flowsheet Row Video [facility] Visit from 09/19/2021 in [facility] Office Visit from 06/06/2021 in [facility] Office Visit from 03/01/2021 in [facility] Clinical Support from 12/14/2020 in [facility]  PHQ-2 Total Score 0 3 6 0 0  PHQ-9 Total Score -- 8 21 -- 0      Flowsheet Row Video Visit from 11/15/2021 [facility] [facility] from 09/21/2020 in [facility] [place] [facility] from 07/31/2020 in [facility] [place]  C-SSRS RISK CATEGORY No Risk No Risk No Risk       Collaboration of Care: Collaboration of Care: Referral or follow-up with counselor/therapist AEB psychotherapy referral  Patient/Guardian was advised Release of Information must be obtained prior to any record release in order to collaborate their care with an outside provider.  Patient/Guardian was advised if they have not already done so to contact the registration department to sign all necessary forms in order for [place] to release information regarding their care.   Consent: Patient/Guardian gives verbal consent for treatment and assignment of benefits for services provided during this visit. Patient/Guardian expressed understanding and agreed to proceed.   Televisit via video: I connected with Antonia on 12/17/21 at 11:00 AM EDT by a video enabled telemedicine application and verified that I am speaking with the correct person using two identifiers.  Location: Patient: home Provider: home office   I discussed the limitations of evaluation and management by telemedicine and the availability of in person appointments. The patient expressed understanding and agreed to proceed.  I discussed the assessment and treatment plan with the patient. The patient was provided an opportunity to ask questions and all were answered. The patient agreed with the plan and demonstrated an understanding of the instructions.   The patient was advised to call back or seek an in-person evaluation if the symptoms worsen or if the condition fails to improve as anticipated.  I provided 15 minutes of non-face-to-face time during this encounter.  Elsie Lincoln, MD 12/17/2021, 11:26 AM

## 2021-12-17 NOTE — Patient Instructions (Addendum)
  Joshua Sanchez , Thank you for taking time to come for your Medicare Wellness Visit. I appreciate your ongoing commitment to your health goals. Please review the following plan we discussed and let me know if I can assist you in the future.   These are the goals we discussed:Increase your activity level. Aim for 150 minutes of exercise per week. This   This is a list of the screening recommended for you and due dates:  Health Maintenance  Topic Date Due   HPV Vaccine (1 - Male 2-dose series) Never done   HIV Screening  Never done   Tetanus Vaccine  Never done   COVID-19 Vaccine (3 - Moderna series) 09/30/2019   Flu Shot  09/18/2021   Medicare Annual Wellness Visit  12/18/2022   Hepatitis C Screening: USPSTF Recommendation to screen - Ages 18-79 yo.  Completed

## 2021-12-17 NOTE — Patient Instructions (Signed)
Please use this website to find access to job opportunities in [place] for folks with autism: [email or web address]  I would also recommend checking out this website which has other organizations and resources available to [place] with his diagnosis of autism: [email or web address]  Otherwise, no medication changes today, Dr. Nehemiah Settle was able to speak with your neurologist and we will be able to continue your wellbutrin. Dr. Nehemiah Settle will also place a referral to psychotherapy.

## 2021-12-23 ENCOUNTER — Other Ambulatory Visit (HOSPITAL_COMMUNITY): Payer: Self-pay | Admitting: Psychiatry

## 2021-12-23 DIAGNOSIS — F330 Major depressive disorder, recurrent, mild: Secondary | ICD-10-CM

## 2021-12-23 DIAGNOSIS — F908 Attention-deficit hyperactivity disorder, other type: Secondary | ICD-10-CM

## 2021-12-23 DIAGNOSIS — F411 Generalized anxiety disorder: Secondary | ICD-10-CM

## 2021-12-24 ENCOUNTER — Ambulatory Visit: Payer: Self-pay | Admitting: Licensed Clinical Social Worker

## 2021-12-24 NOTE — Patient Instructions (Signed)
Visit Information  Thank you for taking time to visit with me today. Please don't hesitate to contact me if I can be of assistance to you.   Following are the goals we discussed today:   Goals Addressed             This Visit's Progress    COMPLETED: Connect with Mental Health provider No Follow up Required       Care Coordination Interventions: Solution-Focused Strategies employed:  Problem [place] strategies reviewed Participation in counseling encouraged : has initial appointment 01/04/22 Assessed for barriers related to ongoing mental health needs        Please call the care guide team at [phone] if you need to cancel or reschedule your appointment.   If you are experiencing a Mental Health or [place] or need someone to talk to, please call the Suicide and Crisis Lifeline: 988 call the [place] National Suicide Prevention Lifeline: 6391854234 or TTY: (980)812-5278 TTY (217) 019-5367) to talk to a trained counselor call the [facility]: [phone]   Patient verbalizes understanding of instructions and care plan provided today and agrees to view in [place]. Active MyChart status and patient understanding of how to access instructions and care plan via MyChart confirmed with patient.     No further follow up required: Care Coordination at this time  Casimer Lanius, [place] [phone]

## 2021-12-24 NOTE — Patient Outreach (Signed)
  Care Coordination  Follow Up Visit Note   12/24/2021 Name: Joshua Sanchez MRN: [id number] DOB: [birth year]  Joshua Sanchez is a 21 y.o. year old male who sees Joshua Sanchez, Joshua Nordmann, MD for primary care. I spoke with  Joshua Sanchez by phone today.  What matters to the patients health and wellness today?    Patient was accompanied by his mother who provided information during this encounter. Patient is making progress with and has connected with a mental health provider for therapy and medication management.   Recommendation: Patient may benefit from, and is in agreement to To keep therapy appointment 11/17 with Joshua Sanchez in [place] and follow up to schedule medication appointments as needed.     Goals Addressed             This Visit's Progress    COMPLETED: Connect with Mental Health provider No Follow up Required       Care Coordination Interventions: Solution-Focused Strategies employed:  Problem [place] strategies reviewed Participation in counseling encouraged : has initial appointment 01/04/22 Assessed for barriers related to ongoing mental health needs        SDOH assessments and interventions completed:  No   Care Coordination Interventions Activated:  Yes  Care Coordination Interventions:  Yes, provided   Follow up plan: No further intervention required. Patient and mother will call LCSW if needed.  Encounter Outcome:  Pt. Visit Completed   Casimer Lanius, [place] [phone]

## 2021-12-24 NOTE — Telephone Encounter (Signed)
Duplicate

## 2022-01-08 ENCOUNTER — Ambulatory Visit: Payer: Medicare Other | Admitting: Neurology

## 2022-01-14 ENCOUNTER — Ambulatory Visit (HOSPITAL_COMMUNITY): Payer: Medicare Other | Admitting: Clinical

## 2022-01-21 ENCOUNTER — Other Ambulatory Visit (HOSPITAL_COMMUNITY): Payer: Self-pay | Admitting: Psychiatry

## 2022-01-21 ENCOUNTER — Other Ambulatory Visit: Payer: Self-pay | Admitting: Nurse Practitioner

## 2022-01-21 DIAGNOSIS — F411 Generalized anxiety disorder: Secondary | ICD-10-CM

## 2022-01-28 ENCOUNTER — Encounter: Payer: Self-pay | Admitting: Neurology

## 2022-01-28 ENCOUNTER — Telehealth (HOSPITAL_COMMUNITY): Payer: Self-pay | Admitting: *Deleted

## 2022-01-28 ENCOUNTER — Ambulatory Visit (INDEPENDENT_AMBULATORY_CARE_PROVIDER_SITE_OTHER): Payer: Medicare Other | Admitting: Neurology

## 2022-01-28 VITALS — BP 135/83 | HR 110 | Ht 70.0 in | Wt 265.2 lb

## 2022-01-28 DIAGNOSIS — G40309 Generalized idiopathic epilepsy and epileptic syndromes, not intractable, without status epilepticus: Secondary | ICD-10-CM

## 2022-01-28 MED ORDER — LEVETIRACETAM 1000 MG PO TABS: ORAL_TABLET | ORAL | 3 refills | Status: DC

## 2022-01-28 NOTE — Progress Notes (Signed)
NEUROLOGY FOLLOW UP OFFICE NOTE  George Haggart [id number] 2000-10-18  HISTORY OF PRESENT ILLNESS: I had the pleasure of seeing Terrelle Ruffolo in follow-up in the neurology clinic on 01/28/2022.  The patient was last seen 7 months ago for seizures. His mother is present to supplement history today. Records and images were personally reviewed where available.  Since his last visit, they report that he is doing well with no seizures since 05/2021 on Levetiracetam 1500mg  BID without side effects. His mother denies any staring/unresponsive episodes. He has occasional brief twitches but would not drop objects or fall. Every once in a while he notices a metallic taste in his mouth with no other symptoms. He denies any loss of time, focal numbness/tingling/weakness. No significant headaches, vision changes, no falls. He was dizzy when he arrived to the office, he thought he was nervous. His mother notes he gets dizzy when nervous. Mood is "crappy" in the morning, then the rest of the day he is fine. He does not sleep well, he either oversleeps or does not sleep at all.     History on Initial Assessment 12/01/2018: This is an 21 year old right-handed man with a history of autism spectrum disorder, ADHD, anxiety, presenting for evaluation of seizures. 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 He would drop things from his hand. One time he got dizzy with the shaking, no associated speech changes or confusion. He was having them at school where teachers would call his mother and tell him he "did not seem right" and was unable to focus after he alerted them about the hand shaking. Shaking would last 6-7 minutes. His mother reports having an EEG in [facility] where "they did not find anything" and he was not started on medication. He was having these episodes once a month, no clear triggers. On 11/01/2018, he recalls waking up feeling fine  and taking a shower, then waking up in the ambulance. He had bitten his lip and felt diffusely weak with a headache. His mother had heard him fall and came to the bathroom to find him with body jerking, head banging on the floor. She thinks he had urinary incontinence. She tried to get him up and he smacked her, confused. She feels his left side was weaker. He was brought to [facility] where CBC, CMP, UDS were normal. I personally reviewed head CT without contrast which was unremarkable. He was discharged home on Levetiracetam 500mg  BID. No further convulsions since then, but he had one episode of hand shaking yesterday where he felt funny/uneasy. He feels tired on the Levetiracetam. He reports headaches for the past 4 years occurring every other day, mostly on the left side with pressure and throbbing. Pain can last all day sometimes, with sensitivity to sound. No nausea/vomiting. He usually lays down if headaches are mild, and would take an Ibuprofen sometimes. He denies any dizziness, diplopia, dysarthria/dysphagia, neck pain, bowel/bladder dysfunction. His mother denies any staring/unresponsive episodes. He denies any gaps in time, olfactory/gustatory hallucinations. Over the past 2 years, he has had sleep difficulties, awake at night and asleep during the day. He may take melatonin or a sleep aid to help sleep at night. His mother administers medications. He does not drive.  He was adopted at age 67, diagnosed with autism spectrum disorder, ADHD, anxiety at age 57 or 84. As far as his mother knows, he had a normal birth and early  development, no febrile convulsions, CNS infections, family history of seizures.   Diagnostic Data: MRI brain with and without contrast done 12/2018 which did not show any acute changes, hippocampi symmetric with no abnormal signal or enhancement seen.  1-hour wake and sleep EEG in 02/2019 was normal  PAST MEDICAL HISTORY: Past Medical History:  Diagnosis Date   ADHD  (attention deficit hyperactivity disorder)    Anxiety and depression 11/13/2020   Autism    OCD (obsessive compulsive disorder)    Seizures (HCC)    focal   Separation anxiety     MEDICATIONS: Current Outpatient Medications on File Prior to Visit  Medication Sig Dispense Refill   acetaminophen (TYLENOL) 500 MG tablet Take 1 tablet (500 mg total) by mouth every 6 (six) hours as needed. 30 tablet 0   buPROPion (WELLBUTRIN XL) 300 MG 24 hr tablet Take 1 tablet (300 mg total) by mouth every morning. 30 tablet 1   busPIRone (BUSPAR) 10 MG tablet Take 1 tablet (10 mg total) by mouth 2 (two) times daily. 60 tablet 1   cloNIDine (CATAPRES) 0.2 MG tablet Take 1 tablet (0.2 mg total) by mouth at bedtime. 30 tablet 1   levETIRAcetam (KEPPRA) 1000 MG tablet TAKE 1 TABLET(1000 MG) BY MOUTH TWICE DAILY (Taking 1 and 1/2 tabs BID) 60 tablet 2   lisinopril (ZESTRIL) 10 MG tablet TAKE 1 TABLET BY MOUTH EVERY DAY FOR BLOOD PRESSURE 90 tablet 2   loratadine (CLARITIN) 10 MG tablet TAKE 1 TABLET BY MOUTH EVERY DAY FOR ALLERGIES 90 tablet 0   No current facility-administered medications on file prior to visit.    ALLERGIES: No Known Allergies  FAMILY HISTORY: Family History  Adopted: Yes  Problem Relation Age of Onset   Cancer Neg Hx    Clotting disorder Neg Hx    Rheumatologic disease Neg Hx     SOCIAL HISTORY: Social History   Socioeconomic History   Marital status: Single    Spouse name: Not on file   Number of children: Not on file   Years of education: Not on file   Highest education level: Not on file  Occupational History   Not on file  Tobacco Use   Smoking status: Never   Smokeless tobacco: Never  Vaping Use   Vaping Use: Never used  Substance and Sexual Activity   Alcohol use: Not Currently    Alcohol/week: 1.0 standard drink of alcohol    Types: 1 Standard drinks or equivalent per week    Comment: infrequent socially   Drug use: Never   Sexual activity: Never  Other  Topics Concern   Not on file  Social History Narrative   Right handed      Highest level of edu- 12th grade      Lives with mom   One story home 5 steps into home   Caff. sodas   Social Determinants of Health   Financial Resource Strain: Low Risk  (12/14/2020)   Overall Financial Resource Strain (CARDIA)    Difficulty of Paying Living Expenses: Not hard at all  Food Insecurity: No Food Insecurity (10/25/2021)   Hunger Vital Sign    Worried About Running Out of Food in the Last Year: Never true    Ran Out of Food in the Last Year: Never true  Transportation Needs: No Transportation Needs (10/25/2021)   PRAPARE - Administrator, Civil Service (Medical): No    Lack of Transportation (Non-Medical): No  Physical Activity: Insufficiently Active (  10/25/2021)   Exercise Vital Sign    Days of Exercise per Week: 1 day    Minutes of Exercise per Session: 20 min  Stress: No Stress Concern Present (12/14/2020)   [employer] of Occupational Health - Occupational Stress Questionnaire    Feeling of Stress : Not at all  Social Connections: Socially Isolated (12/14/2020)   Social Connection and Isolation Panel [NHANES]    Frequency of Communication with Friends and Family: More than three times a week    Frequency of Social Gatherings with Friends and Family: More than three times a week    Attends Religious Services: Never    Database administrator or Organizations: No    Attends Banker Meetings: Never    Marital Status: Never married  Intimate Partner Violence: Not At Risk (12/14/2020)   Humiliation, Afraid, Rape, and Kick questionnaire    Fear of Current or Ex-Partner: No    Emotionally Abused: No    Physically Abused: No    Sexually Abused: No     PHYSICAL EXAM: Vitals:   01/28/22 1307  BP: 135/83  Pulse: (!) 110  SpO2: 97%   General: No acute distress, flat affect Head:  Normocephalic/atraumatic Skin/Extremities: No rash, no edema Neurological  Exam: alert and awake. No aphasia or dysarthria. Fund of knowledge is appropriate.  Attention and concentration are normal.   Cranial nerves: Pupils equal, round. Extraocular movements intact with no nystagmus. Visual fields full.  No facial asymmetry.  Motor: Bulk and tone normal, muscle strength 5/5 throughout with no pronator drift.   Finger to nose testing intact.  Gait narrow-based and steady, able to tandem walk adequately.  Romberg negative.   IMPRESSION: This is a 21 yo RH man with a history of autism spectrum disorder, ADHD, anxiety, with generalized convulsions, seizure-free for over 2 years until he had 2 seizures in April 2023 in the setting of missing medication and sleep deprivation. His mother now administers medications, seizure-free since April 2023, continue Levetiracetam 1500mg  BID. Continue follow-up with Psychiatry, discuss sleep difficulties with Psych. He does not drive but his mother asks about getting his learner's permit. We discussed [place] driving laws to stop driving after a seizure until 6 months seizure-free. Follow-up in 6 months, call for any changes.     Thank you for allowing me to participate in his care.  Please do not hesitate to call for any questions or concerns.    , M.D.   CC: Dr. Patrcia Dolly

## 2022-01-28 NOTE — Patient Instructions (Signed)
Good to see you doing well. Continue Levetiracetam (Keppra) 1000mg : take 1 and 1/2 tablets twice a day. Follow-up in 6 months, call for any changes.    Seizure Precautions: 1. If medication has been prescribed for you to prevent seizures, take it exactly as directed.  Do not stop taking the medicine without talking to your doctor first, even if you have not had a seizure in a long time.   2. Avoid activities in which a seizure would cause danger to yourself or to others.  Don't operate dangerous machinery, swim alone, or climb in high or dangerous places, such as on ladders, roofs, or girders.  Do not drive unless your doctor says you may.  3. If you have any warning that you may have a seizure, lay down in a safe place where you can't hurt yourself.    4.  No driving for 6 months from last seizure, as per [facility].   Please refer to the following link on the Epilepsy Foundation of America's website for more information: http://www.epilepsyfoundation.org/answerplace/Social/driving/drivingu.cfm   5.  Maintain good sleep hygiene. Avoid alcohol.  6.  Contact your doctor if you have any problems that may be related to the medicine you are taking.  7.  Call 911 and bring the patient back to the ED if:        A.  The seizure lasts longer than 5 minutes.       B.  The patient doesn't awaken shortly after the seizure  C.  The patient has new problems such as difficulty seeing, speaking or moving  D.  The patient was injured during the seizure  E.  The patient has a temperature over 102 F (39C)  F.  The patient vomited and now is having trouble breathing

## 2022-01-28 NOTE — Telephone Encounter (Signed)
Patient mother called stating she is needing refills for his Hydroxyzine. Medication is not on his current medication list.

## 2022-01-29 ENCOUNTER — Telehealth (HOSPITAL_COMMUNITY): Payer: Medicare Other | Admitting: Psychiatry

## 2022-01-29 ENCOUNTER — Encounter (HOSPITAL_COMMUNITY): Payer: Self-pay

## 2022-01-30 ENCOUNTER — Ambulatory Visit (INDEPENDENT_AMBULATORY_CARE_PROVIDER_SITE_OTHER): Payer: Medicare Other | Admitting: Clinical

## 2022-01-30 ENCOUNTER — Encounter (HOSPITAL_COMMUNITY): Payer: Self-pay

## 2022-01-30 DIAGNOSIS — F331 Major depressive disorder, recurrent, moderate: Secondary | ICD-10-CM | POA: Diagnosis not present

## 2022-01-30 DIAGNOSIS — F419 Anxiety disorder, unspecified: Secondary | ICD-10-CM | POA: Diagnosis not present

## 2022-01-30 DIAGNOSIS — F840 Autistic disorder: Secondary | ICD-10-CM

## 2022-01-30 DIAGNOSIS — F908 Attention-deficit hyperactivity disorder, other type: Secondary | ICD-10-CM

## 2022-01-30 NOTE — Progress Notes (Signed)
IN PERSON  I connected with Joshua HolmesJason Sanchez on 01/30/22 at 11:00 AM EST in person and verified that I am speaking with the correct person using two identifiers.  Location: Patient: Office Provider: Office   I discussed the limitations of evaluation and management by telemedicine and the availability of in person appointments. The patient expressed understanding and agreed to proceed. 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 No data recorded Referral name: No data recorded Referral phone number: No data recorded  Whom do you see for routine medical problems? No data recorded Practice/Facility Name: No data recorded Practice/Facility Phone Number: No data recorded Name of Contact: No data recorded Contact Number: No data recorded Contact Fax Number: No data recorded Prescriber Name: No data recorded Prescriber Address (if known): No data recorded  What Is the Reason for Your Visit/Call Today? No data recorded How Long Has This Been Causing You Problems? No data recorded What Do You Feel Would Help You the Most Today? No data recorded  Have You Recently Been in Any Inpatient Treatment ([facility]/Detox/Crisis Center/28-Day Program)? No data recorded Name/Location of Program/Sanchez:No data recorded How Long Were You There? No data recorded When Were You Discharged? No data recorded  [facility]? No data recorded [facility]? No data recorded  Have You Recently Had Any Thoughts About Hurting Yourself? No data recorded Are You Planning to Commit Suicide/Harm Yourself At This time? No data recorded  Have you Recently Had Thoughts About  Hurting Someone [facility]? No data recorded Explanation: No data recorded  Have You Used Any Alcohol or Drugs in the Past 24 Hours? No data recorded How Long Ago Did You Use Drugs or Alcohol? No data recorded What Did You Use and How Much? No data recorded  Do You Currently Have a Therapist/Psychiatrist? No data recorded Name of Therapist/Psychiatrist: No data recorded  Have You Been Recently Discharged From Any Office Practice or Programs? No data recorded Explanation of Discharge From Practice/Program: No data recorded    CCA Screening Triage Referral Assessment Type of Contact: No data recorded Is this Initial or Reassessment? No data recorded Date Telepsych consult ordered in [facility]:  No data recorded Time Telepsych consult ordered in [facility]:  No data recorded  Patient Reported Information Reviewed? No data recorded Patient Left Without Being Seen? No data recorded Reason for Not Completing Assessment: No data recorded  Collateral Involvement: No data recorded  Does Patient Have a Court Appointed Legal Guardian? No data recorded Name and Contact of Legal Guardian: No data recorded If Minor and Not Living with Parent(s), Who has Custody? No data recorded Is CPS involved or ever been involved? No data recorded Is APS involved or ever been involved? No data recorded  Patient Determined To Be At Risk for Harm To Self or Others Based on Review of Patient Reported Information or Presenting Complaint? No data recorded Method: No data recorded Availability of Means: No data recorded Intent: No data recorded Notification Required: No data recorded Additional Information for Danger to Others Potential: No data recorded Additional Comments for Danger to Others Potential: No data recorded Are There Guns or Other Weapons in Your Home? No data recorded  Types of Guns/Weapons: No data recorded Are These Weapons Safely Secured?                            No data recorded Who Could Verify You Are  Able To Have These Secured: No data recorded Do You Have any Outstanding Charges, Pending Court Dates, Parole/Probation? No data recorded Contacted To Inform of Risk of Harm To Self or Others: No data recorded  Location of Assessment: No data recorded  Does Patient Present under Involuntary Commitment? No data recorded IVC Papers Initial File Date: No data recorded  [place] of Residence: No data recorded  Patient Currently Receiving the Following Services: No data recorded  Determination of Need: No data recorded  Options For Referral: No data recorded    CCA Biopsychosocial Intake/Chief Complaint:  The patient was referred by Dr. Adrian Blackwater who he sees for Medication Mangement . The patient identifies difficulty with Anxiety, Depression, and sleep cycle. The patient has childhood dx of ADHD and is currently on med management for this.  Current Symptoms/Problems: The patient verbalizes difficulty with mood management, anxiety, and sleep cycle   Patient Reported Schizophrenia/Schizoaffective Diagnosis in Past: No   Strengths: The patient notes he is a good listener for others.  Preferences: Watching Tv or playing on computer  Abilities: Playing on Computer   Type of Services Patient Feels are Needed: The patient is currently seeing Dr Adrian Blackwater (Med Management) and Individual Therapy.   Initial Clinical Notes/Concerns: The patient has had prior counseling with [facility]. (2023) The patient is currently seeing Dr. Adrian Blackwater for Med Management. No prior hospitalizations for MH.   Mental Sanchez Symptoms Depression:   Change in energy/activity; Difficulty Concentrating; Fatigue; Hopelessness; Increase/decrease in appetite; Irritability; Sleep (too much or little); Tearfulness; Weight gain/loss   Duration of Depressive symptoms:  Greater than two weeks   Mania:   None   Anxiety:    Worrying; Tension; Sleep; Irritability; Fatigue; Difficulty concentrating    Psychosis:   None   Duration of Psychotic symptoms: NA  Trauma:   None   Obsessions:   None   Compulsions:   None   Inattention:   None   Hyperactivity/Impulsivity:   None   Oppositional/Defiant Behaviors:   None   Emotional Irregularity:   None   Other Mood/Personality Symptoms:   NA    Mental Status Exam Appearance and self-care  Stature:   Average   Weight:   Overweight   Clothing:   Casual   Grooming:   Normal   Cosmetic use:   None   Posture/gait:   Normal   Motor activity:   Not Remarkable   Sensorium  Attention:   Distractible   Concentration:   Anxiety interferes   Orientation:   X5   Recall/memory:  Difficulty with short term  Affect and Mood  Affect:   Appropriate   Mood:   Anxious; Depressed   Relating  Eye contact:   Normal   Facial expression:   Depressed; Responsive   Attitude toward examiner:   Cooperative   Thought and Language  Speech flow:  Normal   Thought content:   Appropriate to Mood and Circumstances   Preoccupation:   None   Hallucinations:   None   Organization:  Logical  Company secretary of Knowledge:   Good   Intelligence:   Average   Abstraction:   Normal   Judgement:   Presenter, broadcasting  Testing:   Realistic   Insight:   Good   Decision Making:   Normal   Social Functioning  Social Maturity:   Isolates   Social Judgement:   Normal   Stress  Stressors:   Grief/losses (Uncle passed within past year.)   Coping Ability:   Normal   Skill Deficits:   None   Supports:   Family; Friends/Service system     Religion: Religion/Spirituality Are You A Religious Person?: No How Might This Affect Treatment?: NA  Leisure/Recreation: Leisure / Recreation Do You Have Hobbies?: Yes Leisure and Hobbies: The patient notes he sometimes collects things and currently has a interest in cars.  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?: No  Education: Education Is Patient Currently Attending School?: No Last Grade Completed: 12 Name of High School: [employer] Did Garment/textile technologist From [employer]?: Yes Did You Attend College?: No Did You Attend Graduate School?: No Did You Have An Individualized Education Program (IIEP): No Did You Have Any Difficulty At School?: No Patient's Education Has Been Impacted by Current Illness: No   CCA Family/Childhood History Family and Relationship History: Family history Marital status: Single Are you sexually active?: No What is your sexual orientation?: Heterosexual Has your sexual activity been affected by drugs, alcohol, medication, or emotional stress?: NA Does patient have children?: No  Childhood History:  Childhood History By whom was/is the patient raised?: Foster parents, Adoptive parents Additional childhood history information: The patient notes he was raised until age 28 by a sister and then put into the foster care system Description of patient's relationship with caregiver when they were a child: The patient notes he was with a family from age [age] and then knows the person who adopted the patient. Patient's description of current relationship with people who raised him/her: The patient notes he has a good relationsip with both his foster Mother and his adoptive Mother. How were you disciplined when you got in trouble as a child/adolescent?: Grounding. Does patient have  siblings?: Yes Number of Siblings: 2 Description of patient's current relationship with siblings: The patient notes having 2 sisters that he is aware of. The patient notes he does not see his bio-sisters at all. Did patient suffer any verbal/emotional/physical/sexual abuse as a child?: No Did patient suffer from severe childhood neglect?: No Has patient ever been sexually abused/assaulted/raped as an adolescent or adult?: No Was the patient ever a victim of a crime or a disaster?: No Witnessed domestic violence?: No Has patient been affected by domestic violence as an adult?: No  Child/Adolescent Assessment:     CCA Substance Use Alcohol/Drug Use: Alcohol / Drug Use Pain Medications: See MAR Prescriptions: See MAR Over the Counter: NONE History of alcohol / drug use?: No history of alcohol / drug abuse                         ASAM's:  Six Dimensions of Multidimensional Assessment  Dimension 1:  Acute Intoxication and/or Withdrawal Potential:      Dimension 2:  Biomedical Conditions and Complications:      Dimension 3:  Emotional, Behavioral, or Cognitive Conditions and Complications:     Dimension 4:  Readiness to Change:     Dimension 5:  Relapse, Continued use, or Continued Problem Potential:     Dimension 6:  Recovery/Living Environment:     ASAM Severity Score:    ASAM Recommended Level of Treatment:     Substance use Disorder (SUD)    Recommendations for Services/Supports/Treatments: Recommendations for Services/Supports/Treatments Recommendations For Services/Supports/Treatments: Individual Therapy, Medication Management  DSM5 Diagnoses: Patient Active Problem List   Diagnosis Date Noted   Generalized anxiety disorder 11/15/2021   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/15/2021   Annual physical exam 09/19/2021   Microcytosis 09/19/2021   Hypertension 06/06/2021   Headache 11/21/2020   Seizures (HCC) 11/13/2020   Back pain  11/13/2020   Autism 11/13/2020   ADHD 11/13/2020   Morbid obesity (HCC) 11/13/2020    Patient Centered Plan: Patient is on the following Treatment Plan(s):  Recurrent Moderate MDD with Anxiety / ADHD / pre-existing dx Autism   Referrals to Alternative Service(s): Referred to Alternative Service(s):   Place:   Date:   Time:    Referred to Alternative Service(s):   Place:   Date:   Time:    Referred to Alternative Service(s):   Place:   Date:   Time:    Referred to Alternative Service(s):   Place:   Date:   Time:      Collaboration of Care: Overview of patient involvement with Med Management with Dr.Stinson.  Patient/Guardian was advised Release of Information must be obtained prior to any record release in order to collaborate their care with an outside provider. Patient/Guardian was advised if they have not already done so to contact the registration department to sign all necessary forms in order for [place] to release information regarding their care.   Consent: Patient/Guardian gives verbal consent for treatment and assignment of benefits for services provided during this visit. Patient/Guardian expressed understanding and agreed to proceed.   I discussed the assessment and treatment plan with the patient. The patient was provided an opportunity to ask questions and all were answered. The patient agreed with the plan and demonstrated an understanding of the instructions.   The patient was advised to call back or seek an in-person evaluation if the symptoms worsen or if the condition fails to improve as anticipated.  I provided 60 minutes of face-to-face time during this encounter.  Winfred Burn, LCSW  01/30/2022

## 2022-01-31 ENCOUNTER — Encounter (HOSPITAL_COMMUNITY): Payer: Self-pay | Admitting: Psychiatry

## 2022-01-31 ENCOUNTER — Telehealth (INDEPENDENT_AMBULATORY_CARE_PROVIDER_SITE_OTHER): Payer: Medicare Other | Admitting: Psychiatry

## 2022-01-31 DIAGNOSIS — F840 Autistic disorder: Secondary | ICD-10-CM

## 2022-01-31 DIAGNOSIS — F908 Attention-deficit hyperactivity disorder, other type: Secondary | ICD-10-CM | POA: Diagnosis not present

## 2022-01-31 DIAGNOSIS — F330 Major depressive disorder, recurrent, mild: Secondary | ICD-10-CM

## 2022-01-31 DIAGNOSIS — F411 Generalized anxiety disorder: Secondary | ICD-10-CM

## 2022-01-31 MED ORDER — BUSPIRONE HCL 10 MG PO TABS: 10.0000 mg | ORAL_TABLET | Freq: Two times a day (BID) | ORAL | 2 refills | Status: DC

## 2022-01-31 MED ORDER — HYDROXYZINE HCL 25 MG PO TABS: 25.0000 mg | ORAL_TABLET | Freq: Three times a day (TID) | ORAL | 2 refills | Status: DC | PRN

## 2022-01-31 MED ORDER — CLONIDINE HCL 0.2 MG PO TABS: 0.2000 mg | ORAL_TABLET | Freq: Every day | ORAL | 2 refills | Status: DC

## 2022-01-31 MED ORDER — BUPROPION HCL ER (XL) 300 MG PO TB24: 300.0000 mg | ORAL_TABLET | Freq: Every morning | ORAL | 2 refills | Status: DC

## 2022-01-31 NOTE — Progress Notes (Signed)
BH MD Outpatient Progress Note  01/31/2022 3:56 PM Joshua Sanchez  MRN:  [id number]  Assessment:  Joshua Sanchez presents for follow-up evaluation. Today, 01/31/22, patient reports overall stability of mood. As in last appointment, due to lack of general structure to his day, will have individual days where he does not sleep and will stay up playing video games. Reviewed general sleep hygiene with patient and his mother.  The rigidity around daily activities and reluctance to engage in job search likely stemming from baseline autism and anxiety socially.  Would not attribute this necessarily to worsening of his depression as he has continued to report no depressed mood.  Have also put in psychotherapy referral for supportive psychotherapy for Joshua Sanchez to assist with motivation. Also provided again general autism resources in [place] and if he is able to get started with these programs will likely have overall improvement to life goals (getting a job) and mood symptoms. Follow up in 2 months.  Identifying Information: Joshua Sanchez is a 21 y.o. 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 Initial presentation on 11/15/21, see that note for full case formulation. Mood symptoms noted to be well controlled on combination of wellbutrin, buspar, and hydroxyzine as needed at that time. He has had formal testing for autism and ADHD after he was adopted at the age of 20 and in interaction with [name], concurred with these diagnosis. His adoptive mother, [name], was present for much of the interview and able to fill in gaps around his history. His main psychiatric symptom was lower frustration tolerance around social interactions and some rigidity of behaviors. Did discuss wellbutrin use with  neurology in between visits and they are ok with continuing wellbutrin at current dosage with regard to seizure risk.    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  Patient was given contact information for behavioral health clinic and was instructed to call 911 for emergencies.   Subjective:  Chief Complaint:  Chief Complaint  Patient presents with   Autism   Anxiety   Follow-up    Interval History: Doing alright. Either not sleeping at all or sleeping too much. A lot of the time not tired. If he gets tired, will lay down and then not be tired. Isn't doing any work or physical activity. Ms. and he haven't looked into autism society [place] resources. No reason for not doing. Anxiety has been fine recently. Reviewed hydroxyzine which he had been taking every morning. Had been taking it for anxiety in school but once he graduated was taking for work but then lost his job and stayed inside since then. Will go out for dog walks and groceries now; would appreciate having for  groceries. Will renew prescription. Gets paranoid that people are watching him. Upset is still random and not happening as much because he is only playing video games. Eating 1.5 meals per day reviewed exercise.    Ms. [name] does not have anything to add today.   Visit Diagnosis:    ICD-10-CM   1. Generalized anxiety disorder  F41.1 buPROPion (WELLBUTRIN XL) 300 MG 24 hr tablet    busPIRone (BUSPAR) 10 MG tablet    hydrOXYzine (ATARAX) 25 MG tablet    2. Mild episode of recurrent major depressive disorder (HCC)  F33.0 buPROPion (WELLBUTRIN XL) 300 MG 24 hr tablet    3. Attention deficit hyperactivity disorder (ADHD), other type  F90.8 buPROPion (WELLBUTRIN XL) 300 MG 24 hr tablet    cloNIDine (CATAPRES) 0.2 MG tablet    4. Autism  F84.0 cloNIDine (CATAPRES) 0.2 MG tablet       Past Psychiatric History:  Diagnoses: autism spectrum disorder, ADHD, generalized anxiety, major depression, generalized idopathic epilepsy Medication trials: yes but current medication is effective for calm. Without it difficult. Ritalin made angry and hyperactive and didn't eat. Prozac like zombie, sertraline sounds familiar.  Previous psychiatrist/therapist: yes since kindergarten and worked closely with [facility]. Hospitalizations: none Suicide attempts: none SIB: none Hx of violence towards others: in defense or when standing too close Current access to guns: none Hx of abuse: none that he is aware of Substance use: none  Past Medical History:  Past Medical History:  Diagnosis Date   ADHD (attention deficit hyperactivity disorder)    Anxiety and depression 11/13/2020   Autism    OCD (obsessive compulsive disorder)    Seizures ([place])    focal   Separation anxiety     Past Surgical History:  Procedure Laterality Date   TONSILLECTOMY      Family Psychiatric History: unknown-adopted  Family History:  Family History  Adopted: Yes  Problem Relation Age of Onset   Cancer Neg Hx    Clotting disorder Neg Hx    Rheumatologic disease Neg Hx     Social History:  Social History   Socioeconomic History   Marital status: Single    Spouse name: Not on file   Number of children: Not on file   Years  of education: Not on file   Highest education level: Not on file  Occupational History   Not on file  Tobacco Use   Smoking status: Never   Smokeless tobacco: Never  Vaping Use   Vaping Use: Never used  Substance and Sexual Activity   Alcohol use: Not Currently    Alcohol/week: 1.0 standard drink of alcohol    Types: 1 Standard drinks or equivalent per week    Comment: infrequent socially   Drug use: Never   Sexual activity: Never  Other Topics Concern   Not on file  Social History Narrative   Right handed      Highest level of edu- 12th grade      Lives with mom   One story home 5 steps into home   Caff. sodas   Social Determinants of Health   Financial Resource Strain: Low Risk  (12/14/2020)   Overall Financial Resource Strain (CARDIA)    Difficulty of Paying Living Expenses: Not hard at all  Food Insecurity: No Food Insecurity (10/25/2021)   Hunger Vital Sign    Worried About Running Out of Food in the Last Year: Never true    [place] in the Last Year:  Never true  Transportation Needs: No Transportation Needs (10/25/2021)   PRAPARE - Administrator, Civil ServiceTransportation    Lack of Transportation (Medical): No    Lack of Transportation (Non-Medical): No  Physical Activity: Insufficiently Active (10/25/2021)   Exercise Vital Sign    Days of Exercise per Week: 1 day    Minutes of Exercise per Session: 20 min  Stress: No Stress Concern Present (12/14/2020)   [employer] - Occupational Stress Questionnaire    Feeling of Stress : Not at all  Social Connections: Socially Isolated (12/14/2020)   Social Connection and Isolation Panel [NHANES]    Frequency of Communication with Friends and Family: More than three times a week    Frequency of Social Gatherings with Friends and Family: More than three times a week    Attends Religious Services: Never    Database administratorActive Member of Clubs or Organizations: No    Attends Engineer, structuralClub or Organization Meetings: Never    Marital Status:  Never married    Allergies: No Known Allergies  Current Medications: Current Outpatient Medications  Medication Sig Dispense Refill   hydrOXYzine (ATARAX) 25 MG tablet Take 1 tablet (25 mg total) by mouth 3 (three) times daily as needed. 30 tablet 2   acetaminophen (TYLENOL) 500 MG tablet Take 1 tablet (500 mg total) by mouth every 6 (six) hours as needed. 30 tablet 0   buPROPion (WELLBUTRIN XL) 300 MG 24 hr tablet Take 1 tablet (300 mg total) by mouth every morning. 30 tablet 2   busPIRone (BUSPAR) 10 MG tablet Take 1 tablet (10 mg total) by mouth 2 (two) times daily. 60 tablet 2   cloNIDine (CATAPRES) 0.2 MG tablet Take 1 tablet (0.2 mg total) by mouth at bedtime. 30 tablet 2   levETIRAcetam (KEPPRA) 1000 MG tablet Take 1 and 1/2 tablets twice a day 270 tablet 3   lisinopril (ZESTRIL) 10 MG tablet TAKE 1 TABLET BY MOUTH EVERY DAY FOR BLOOD PRESSURE 90 tablet 2   loratadine (CLARITIN) 10 MG tablet TAKE 1 TABLET BY MOUTH EVERY DAY FOR ALLERGIES 90 tablet 0   No current facility-administered medications for this visit.    ROS: Review of Systems  Constitutional:  Negative for appetite change and unexpected weight change.  Neurological:  Negative for seizures.  Psychiatric/Behavioral:  Positive for decreased concentration and sleep disturbance. Negative for dysphoric mood, hallucinations, self-injury and suicidal ideas. The patient is not nervous/anxious.     Objective:  Psychiatric Specialty Exam: There were no vitals taken for this visit.There is no height or weight on file to calculate BMI.  General Appearance: Casual, Fairly Groomed, and wearing glasses. Appears stated age  Eye Contact:  Minimal  Speech:  Clear and Coherent, Normal Rate, and overall one to two word answers with short sentence structure  Volume:  Normal  Mood:   "fine"  Affect:  Appropriate, Congruent, and Constricted  Thought Content: Logical and Hallucinations: None   Suicidal Thoughts:  No  Homicidal  Thoughts:  No  Thought Process:  Goal Directed and concrete  Orientation:  Full (Time, Place, and Person)    Memory:  Immediate;   Poor Recent;   Poor Remote;   Fair  Judgment:  Fair; limited at baseline  Insight:  Fair; limited at baseline  Concentration:  Concentration: Fair and Attention Span: Fair  Recall:  Poor; limited at baseline  Fund of Knowledge: Fair  Language: Fair  Psychomotor Activity:  Normal  Akathisia:  No  AIMS (if indicated): not done  Assets:  Desire for Improvement Financial Resources/Insurance Housing Leisure Time Physical Health Resilience Social Support Transportation  ADL's:  Intact  Cognition: WNL  Sleep:  Poor   PE: General: sits comfortably in view of camera; no acute distress  Pulm: no increased work of breathing on room air MSK: all extremity movements appear intact  Neuro: no focal neurological deficits observed  Gait & Station: unable to assess by video    Metabolic Disorder Labs: No results found for: "HGBA1C", "MPG" No results found for: "PROLACTIN" Lab Results  Component Value Date   CHOL 148 09/20/2021   TRIG 98 09/20/2021   HDL 35 (L) 09/20/2021   CHOLHDL 4.2 09/20/2021   LDLCALC 95 09/20/2021   Lab Results  Component Value Date   TSH 2.320 09/20/2021    Therapeutic Level Labs: No results found for: "LITHIUM" No results found for: "VALPROATE" No results found for: "CBMZ"  Screenings:  GAD-7    Flowsheet Row Counselor from 01/30/2022 in [facility]  Total GAD-7 Score 7      PHQ2-9    Flowsheet Row Counselor from 01/30/2022 in [place] Office Visit from 12/17/2021 in [facility] Video Visit from 11/15/2021 in [place] Office Visit from 09/19/2021 in [facility] Office Visit from 06/06/2021 in [facility]  PHQ-2 Total Score 6 2 0 3 6  PHQ-9 Total Score  18 -- -- 8 21      Flowsheet Row Counselor from 01/30/2022 in [place] [facility] Video Visit from 11/15/2021 in [facility] from 09/21/2020 in [facility] [place] No Risk No Risk No Risk       Collaboration of Care: Collaboration of Care: Referral or follow-up with counselor/therapist AEB psychotherapy referral  Patient/Guardian was advised Release of Information must be obtained prior to any record release in order to collaborate their care with an outside provider. Patient/Guardian was advised if they have not already done so to contact the registration department to sign all necessary forms in order for [place] to release information regarding their care.   Consent: Patient/Guardian gives verbal consent for treatment and assignment of benefits for services provided during this visit. Patient/Guardian expressed understanding and agreed to proceed.   Televisit via video: I connected with Domenique on 01/31/22 at  3:30 PM EST by a video enabled telemedicine application and verified that I am speaking with the correct person using two identifiers.  Location: Patient: home Provider: home office   I discussed the limitations of evaluation and management by telemedicine and the availability of in person appointments. The patient expressed understanding and agreed to proceed.  I discussed the assessment and treatment plan with the patient. The patient was provided an opportunity to ask questions and all were answered. The patient agreed with the plan and demonstrated an understanding of the instructions.   The patient was advised to call back or seek an in-person evaluation if the symptoms worsen or if the condition fails to improve as anticipated.  I provided 15 minutes of non-face-to-face time during this encounter.  Jacquelynn Cree, MD 01/31/2022, 3:56 PM

## 2022-01-31 NOTE — Patient Instructions (Signed)
Please use this website to find access to job opportunities in [place] for folks with autism: [email or web address]   I would also recommend checking out this website which has other organizations and resources available to [place] with his diagnosis of autism: [email or web address]   Otherwise, no medication changes today.

## 2022-02-27 ENCOUNTER — Other Ambulatory Visit (HOSPITAL_COMMUNITY): Payer: Self-pay | Admitting: Psychiatry

## 2022-02-27 DIAGNOSIS — F411 Generalized anxiety disorder: Secondary | ICD-10-CM

## 2022-02-27 DIAGNOSIS — F908 Attention-deficit hyperactivity disorder, other type: Secondary | ICD-10-CM

## 2022-02-27 DIAGNOSIS — F330 Major depressive disorder, recurrent, mild: Secondary | ICD-10-CM

## 2022-03-07 ENCOUNTER — Ambulatory Visit (HOSPITAL_COMMUNITY): Payer: Medicare Other | Admitting: Clinical

## 2022-03-22 ENCOUNTER — Ambulatory Visit: Payer: Medicare Other | Admitting: Internal Medicine

## 2022-03-22 ENCOUNTER — Ambulatory Visit: Payer: Medicare Other | Admitting: Nurse Practitioner

## 2022-03-22 ENCOUNTER — Encounter: Payer: Self-pay | Admitting: Internal Medicine

## 2022-03-31 ENCOUNTER — Other Ambulatory Visit (HOSPITAL_COMMUNITY): Payer: Self-pay | Admitting: Psychiatry

## 2022-03-31 DIAGNOSIS — F411 Generalized anxiety disorder: Secondary | ICD-10-CM

## 2022-04-10 ENCOUNTER — Ambulatory Visit: Payer: Medicare HMO | Admitting: Internal Medicine

## 2022-04-10 ENCOUNTER — Encounter: Payer: Self-pay | Admitting: Internal Medicine

## 2022-04-10 VITALS — BP 113/78 | HR 116 | Ht 69.0 in | Wt 261.0 lb

## 2022-04-10 DIAGNOSIS — F908 Attention-deficit hyperactivity disorder, other type: Secondary | ICD-10-CM

## 2022-04-10 DIAGNOSIS — F840 Autistic disorder: Secondary | ICD-10-CM | POA: Diagnosis not present

## 2022-04-10 DIAGNOSIS — Z23 Encounter for immunization: Secondary | ICD-10-CM | POA: Diagnosis not present

## 2022-04-10 DIAGNOSIS — F330 Major depressive disorder, recurrent, mild: Secondary | ICD-10-CM

## 2022-04-10 DIAGNOSIS — R569 Unspecified convulsions: Secondary | ICD-10-CM

## 2022-04-10 DIAGNOSIS — F411 Generalized anxiety disorder: Secondary | ICD-10-CM

## 2022-04-10 DIAGNOSIS — I10 Essential (primary) hypertension: Secondary | ICD-10-CM | POA: Diagnosis not present

## 2022-04-10 NOTE — Assessment & Plan Note (Signed)
Followed by psychiatry (Dr. Nehemiah Settle).  He is currently prescribed clonidine and Wellbutrin.  Mood is stable currently. -No medication changes today

## 2022-04-10 NOTE — Assessment & Plan Note (Signed)
He is currently prescribed lisinopril 10 mg daily as well as clonidine 0.2 mg nightly for treatment of hypertension.  His blood pressure today is 113/78. -No medication changes today.  BP is adequately controlled.

## 2022-04-10 NOTE — Assessment & Plan Note (Signed)
Followed by psychiatry.  He is prescribed BuSpar 10 mg twice daily as well as hydroxyzine 25 mg twice daily as needed (anxiety).  His PHQ-9 score is elevated today (20).  He denies SI/HI and feels as though his mood is stable.  He is not interested in making medication changes today.   -He is scheduled for follow-up with counseling and psychiatry next month.

## 2022-04-10 NOTE — Assessment & Plan Note (Signed)
History of generalized hepatic epilepsy.  He is currently prescribed Keppra 1500 mg twice daily.  Followed by neurology (Dr. Delice Lesch).  He was last evaluated by neurology on 01/28/22.  He reports today that he had a seizure in late December 2023. -No medication changes today.  Continue Keppra 1500 mg twice daily. -Neurology follow-up is scheduled for June

## 2022-04-10 NOTE — Progress Notes (Signed)
Established Patient Office Visit  Subjective   Patient ID: Joshua Sanchez, male    DOB: Feb 21, 2000  Age: 22 y.o. MRN: [id number]  Chief Complaint  Patient presents with   Hypertension    Follow up   Joshua Sanchez returns to care today for routine follow-up.  He was last seen at [facility] on 09/19/21 by Vena Rua, NP for his annual exam.  In the interim he has been seen by psychiatry and neurology for follow-up.  Joshua Sanchez reports feeling well today.  He is asymptomatic and has no acute concerns to discuss.  Past Medical History:  Diagnosis Date   ADHD (attention deficit hyperactivity disorder)    Anxiety and depression 11/13/2020   Autism    OCD (obsessive compulsive disorder)    Seizures (HCC)    focal   Separation anxiety    Past Surgical History:  Procedure Laterality Date   TONSILLECTOMY     Social History   Tobacco Use   Smoking status: Never   Smokeless tobacco: Never  Vaping Use   Vaping Use: Never used  Substance Use Topics   Alcohol use: Not Currently    Alcohol/week: 1.0 standard drink of alcohol    Types: 1 Standard drinks or equivalent per week    Comment: infrequent socially   Drug use: Never   Family History  Adopted: Yes  Problem Relation Age of Onset   Cancer Neg Hx    Clotting disorder Neg Hx    Rheumatologic disease Neg Hx    No Known Allergies  Review of Systems  Constitutional:  Negative for chills and fever.  HENT:  Negative for sore throat.   Respiratory:  Negative for cough and shortness of breath.   Cardiovascular:  Negative for chest pain, palpitations and leg swelling.  Gastrointestinal:  Negative for abdominal pain, blood in stool, constipation, diarrhea, nausea and vomiting.  Genitourinary:  Negative for dysuria and hematuria.  Musculoskeletal:  Negative for myalgias.  Skin:  Negative for itching and rash.  Neurological:  Negative for dizziness and headaches.  Psychiatric/Behavioral:  Negative for depression and suicidal ideas.  The patient has insomnia.      Objective:     BP 113/78   Pulse (!) 116   Ht 5' 9"$  (1.753 m)   Wt 261 lb (118.4 kg)   SpO2 94%   BMI 38.54 kg/m  BP Readings from Last 3 Encounters:  04/10/22 113/78  01/28/22 135/83  09/19/21 133/84   Physical Exam Vitals reviewed.  Constitutional:      General: He is not in acute distress.    Appearance: Normal appearance. He is obese. He is not ill-appearing.  HENT:     Head: Normocephalic and atraumatic.     Right Ear: External ear normal.     Left Ear: External ear normal.     Nose: Nose normal. No congestion or rhinorrhea.     Mouth/Throat:     Mouth: Mucous membranes are moist.     Pharynx: Oropharynx is clear.  Eyes:     General: No scleral icterus.    Extraocular Movements: Extraocular movements intact.     Conjunctiva/sclera: Conjunctivae normal.     Pupils: Pupils are equal, round, and reactive to light.  Cardiovascular:     Rate and Rhythm: Normal rate and regular rhythm.     Pulses: Normal pulses.     Heart sounds: Normal heart sounds. No murmur heard. Pulmonary:     Effort: Pulmonary effort is normal.  Breath sounds: Normal breath sounds. No wheezing, rhonchi or rales.  Abdominal:     General: Abdomen is flat. Bowel sounds are normal. There is no distension.     Palpations: Abdomen is soft.     Tenderness: There is no abdominal tenderness.  Musculoskeletal:        General: No swelling or deformity. Normal range of motion.     Cervical back: Normal range of motion.  Skin:    General: Skin is warm and dry.     Capillary Refill: Capillary refill takes less than 2 seconds.  Neurological:     General: No focal deficit present.     Mental Status: He is alert and oriented to person, place, and time.     Motor: No weakness.  Psychiatric:        Mood and Affect: Mood normal.        Behavior: Behavior normal.        Thought Content: Thought content normal.   Last CBC Lab Results  Component Value Date   WBC 9.1  09/20/2021   HGB 14.6 09/20/2021   HCT 45.7 09/20/2021   MCV 79 09/20/2021   MCH 25.3 (L) 09/20/2021   RDW 14.3 09/20/2021   PLT 209 [id number]   Last metabolic panel Lab Results  Component Value Date   GLUCOSE 88 06/06/2021   NA 143 06/06/2021   K 4.5 06/06/2021   CL 102 06/06/2021   CO2 24 06/06/2021   BUN 7 06/06/2021   CREATININE 1.25 06/06/2021   EGFR 84 06/06/2021   CALCIUM 9.7 06/06/2021   PROT 7.5 06/06/2021   ALBUMIN 4.3 06/06/2021   LABGLOB 3.2 06/06/2021   AGRATIO 1.3 06/06/2021   BILITOT 0.3 06/06/2021   ALKPHOS 89 06/06/2021   AST 20 06/06/2021   ALT 26 06/06/2021   ANIONGAP 9 11/01/2018   Last lipids Lab Results  Component Value Date   CHOL 148 09/20/2021   HDL 35 (L) 09/20/2021   LDLCALC 95 09/20/2021   TRIG 98 09/20/2021   CHOLHDL 4.2 09/20/2021   Last thyroid functions Lab Results  Component Value Date   TSH 2.320 09/20/2021     Assessment & Plan:   Problem List Items Addressed This Visit       Hypertension - Primary    He is currently prescribed lisinopril 10 mg daily as well as clonidine 0.2 mg nightly for treatment of hypertension.  His blood pressure today is 113/78. -No medication changes today.  BP is adequately controlled.      Seizures ([place])    History of generalized hepatic epilepsy.  He is currently prescribed Keppra 1500 mg twice daily.  Followed by neurology (Dr. Delice Lesch).  He was last evaluated by neurology on 01/28/22.  He reports today that he had a seizure in late December 2023. -No medication changes today.  Continue Keppra 1500 mg twice daily. -Neurology follow-up is scheduled for June       Autism    Followed by psychiatry (Dr. Nehemiah Settle).  He is currently prescribed clonidine and Wellbutrin.  Mood is stable currently. -No medication changes today      Mild episode of recurrent major depressive disorder ([place])    Followed by psychiatry.  He is prescribed BuSpar 10 mg twice daily as well as hydroxyzine 25 mg twice  daily as needed (anxiety).  His PHQ-9 score is elevated today (20).  He denies SI/HI and feels as though his mood is stable.  He is not interested in making medication  changes today.   -He is scheduled for follow-up with counseling and psychiatry next month.      Need for influenza vaccination    Influenza vaccine administered today       Return in about 6 months (around 10/09/2022) for CPE.    Johnette Abraham, MD

## 2022-04-10 NOTE — Assessment & Plan Note (Signed)
Influenza vaccine administered today.

## 2022-04-10 NOTE — Patient Instructions (Signed)
It was a pleasure to see you today.  Thank you for giving [place] the opportunity to be involved in your care.  Below is a brief recap of your visit and next steps.  We will plan to see you again in 6 months.  Summary No medication changes today You will receive your flu shot We will plan for follow up for your annual exam and repeat labs in 6 months

## 2022-04-17 ENCOUNTER — Other Ambulatory Visit: Payer: Self-pay | Admitting: Internal Medicine

## 2022-04-18 ENCOUNTER — Encounter: Payer: Self-pay | Admitting: Radiology

## 2022-04-25 ENCOUNTER — Ambulatory Visit (INDEPENDENT_AMBULATORY_CARE_PROVIDER_SITE_OTHER): Payer: Medicare HMO | Admitting: Clinical

## 2022-04-25 DIAGNOSIS — F908 Attention-deficit hyperactivity disorder, other type: Secondary | ICD-10-CM

## 2022-04-25 DIAGNOSIS — F840 Autistic disorder: Secondary | ICD-10-CM

## 2022-04-25 DIAGNOSIS — F331 Major depressive disorder, recurrent, moderate: Secondary | ICD-10-CM | POA: Diagnosis not present

## 2022-04-25 DIAGNOSIS — F419 Anxiety disorder, unspecified: Secondary | ICD-10-CM | POA: Diagnosis not present

## 2022-04-25 NOTE — Progress Notes (Signed)
IN PERSON  I connected with Joshua Sanchez on 04/25/22 at  3:00 PM EST in person and verified that I am speaking with the correct person using two identifiers.  Location: Patient: Office Provider: Office   I discussed the limitations of evaluation and management by telemedicine and the availability of in person appointments. The patient expressed understanding and agreed to proceed. (IN PERSON)    THERAPIST PROGRESS NOTE   Session Time: 2:30 PM- 3:30 PM   Participation Level: Active   Behavioral Response: Casual Alert Elated   Type of Therapy: Individual Therapy   Treatment Goals addressed: Coping, Depression   Interventions: CBT, Motivational Interviewing, Strength-based and Supportive, Behavior Modification   Summary: Joshua Sanchez is a 22 y.o. male who presents with Depression with Anxiety/ ADHD/Autism. The OPT therapist worked with the patient for his OPT treatment. The OPT therapist utilized Motivational Interviewing to assist in creating therapeutic repore. The OPT therapist gained feedback about the patients triggers and symptoms over the past few week.The patient spoke about considering taking on a trade and doing classes at [employer] and/or getting a job.. The OPT therapist utilized Cognitive Behavioral Therapy through cognitive restructuring as well as worked on coping strategies to assist in management of his mental health symptoms. The patient identified reviewed his basic health areas including sleep cycle, hygiene, eating habits/nutrition/ and exercise. The OPT therapist overviewed with the patient interactions with family and friends. The OPT therapist gauged the patients mood over the past few weeks. The OPT therapist worked with the patient on taking a break and listening to music as coping strategies. The OPT therapist overviewed upcoming appointments as listed in the patients MyChart.     Suicidal/Homicidal: Nowithout intent/plan   Therapist  Response: The OPT therapist worked with the patient for the patients scheduled session. The patient was engaged in his session and gave feedback in relation to triggers, symptoms, and behavior responses over the past few weeks. The patient spoke about his interactions at home over the course of the past few weeks. The patient spoke about his desire to get into classes for welding and he is currently also looking for employment. The OPT therapist worked utilizing an in Warden/ranger Therapy exercise. The patient noted, " I would like to socialize more and if I go to work or take classes I would also be around more people so I could maybe socialize more with people my age". . The patient verbalized that he feels his medication management is working, however, is struggling with mood and will give feedback at his upcoming med therapy appointment with Dr. Nehemiah Sanchez . The patient verbalized his intent to continue to work on reactive behavior,mood management, and interactions. The OPT therapist overviewed with the patient copings skills and his support network.The OPT therapist will continue treatment work with the patient in his next session.      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 Nehemiah Sanchez.    Patient/Guardian was advised Release of Information must be obtained prior to any record release in order to collaborate their care with an outside provider. Patient/Guardian was advised if they have not already done so to contact the registration department to sign all necessary forms in order for [place] to release information  regarding their care.    Consent: Patient/Guardian gives verbal consent for treatment and assignment of benefits for services provided during this visit. Patient/Guardian expressed understanding and  agreed to proceed.    I discussed the assessment and treatment plan with the patient. The patient was provided an opportunity to ask questions and all were answered. The patient agreed with the plan and demonstrated an understanding of the instructions.   The patient was advised to call back or seek an in-person evaluation if the symptoms worsen or if the condition fails to improve as anticipated.   I provided 30 minutes of face-to-face time during this encounter.     Joshua Hides, LCSW   04/25/2022

## 2022-05-02 ENCOUNTER — Other Ambulatory Visit: Payer: Self-pay | Admitting: Internal Medicine

## 2022-05-02 ENCOUNTER — Encounter (HOSPITAL_COMMUNITY): Payer: Self-pay | Admitting: Psychiatry

## 2022-05-02 ENCOUNTER — Telehealth (HOSPITAL_COMMUNITY): Payer: Medicare HMO | Admitting: Psychiatry

## 2022-05-02 DIAGNOSIS — F411 Generalized anxiety disorder: Secondary | ICD-10-CM | POA: Diagnosis not present

## 2022-05-02 DIAGNOSIS — F840 Autistic disorder: Secondary | ICD-10-CM

## 2022-05-02 DIAGNOSIS — F330 Major depressive disorder, recurrent, mild: Secondary | ICD-10-CM | POA: Diagnosis not present

## 2022-05-02 DIAGNOSIS — F908 Attention-deficit hyperactivity disorder, other type: Secondary | ICD-10-CM

## 2022-05-02 DIAGNOSIS — R718 Other abnormality of red blood cells: Secondary | ICD-10-CM

## 2022-05-02 MED ORDER — BUSPIRONE HCL 10 MG PO TABS: 10.0000 mg | ORAL_TABLET | Freq: Two times a day (BID) | ORAL | 2 refills | Status: DC

## 2022-05-02 MED ORDER — BUPROPION HCL ER (XL) 300 MG PO TB24: 300.0000 mg | ORAL_TABLET | Freq: Every day | ORAL | 2 refills | Status: DC

## 2022-05-02 MED ORDER — HYDROXYZINE HCL 25 MG PO TABS: 25.0000 mg | ORAL_TABLET | Freq: Two times a day (BID) | ORAL | 2 refills | Status: DC | PRN

## 2022-05-02 MED ORDER — CLONIDINE HCL 0.2 MG PO TABS: 0.2000 mg | ORAL_TABLET | Freq: Every day | ORAL | 2 refills | Status: DC

## 2022-05-02 NOTE — Patient Instructions (Addendum)
We did not make any medication changes today.  Your mood will improve if you can be up and about more frequently with regular exercise and getting outdoors.  I will coordinate with Dr. Doren Custard to get some vitamin levels checked as well.  Do please contact the autism Society and that should be able to help directed towards vocational rehab: [email or web address]

## 2022-05-02 NOTE — Progress Notes (Signed)
[name] Outpatient Progress Note  05/02/2022 10:25 AM Joshua Sanchez  MRN:  [id number]  Assessment:  Joshua Sanchez presents for follow-up evaluation. Today, 05/02/22, patient reports worsening of mood in the setting of his caretaker Ms. [name] passing out on the front porch and having go to the hospital for 3 days.  She adds that he has been checking on her repeatedly and has not necessarily been responding to her telling him that she is doing better.  Outside of this, he continues to only play video games all day and talk with friends through the Internet.  Strongly encouraged him to reach out to the autism Society for vocational rehab which she has not done yet and to begin to do much more regular daily exercise and getting outdoors.  He was able to start psychotherapy and imagine this will be helpful; no medication changes today. As in last appointment, due to lack of general structure to his day, will have individual days where he does not sleep and will stay up playing video games. Reviewed general sleep hygiene with patient and his mother.  The rigidity around daily activities and reluctance to engage in job search likely stemming from baseline autism and anxiety socially.  Will coordinate with PCP to get vitamin D and iron panel given his chronic poor diet and not going outside.  Follow up in 2 months.  Identifying Information: Joshua Sanchez is a 22 y.o. 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 Initial presentation on 11/15/21, see that note for full case formulation. Mood symptoms noted to be well controlled on combination of wellbutrin, buspar, and hydroxyzine as needed at that time. He has had formal testing for autism and ADHD after he was adopted at the age of 6 and in  interaction with [facility], concurred with these diagnosis. His adoptive mother, [name], was present for much of the interview and able to fill in gaps around his history. His main psychiatric symptom was lower frustration tolerance around social interactions and some rigidity of behaviors. Did discuss wellbutrin use with neurology in between visits and they are ok with continuing wellbutrin at current dosage with regard to seizure risk.    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 Delice Lesch  Patient was given contact information for behavioral health clinic and was instructed to call 911 for emergencies.   Subjective:  Chief Complaint:  Chief Complaint  Patient presents with   Autism   Follow-up   Depression   Anxiety    Interval History: Link sent to [phone].   Doing alright. Went to his first therapy session which went well. Mainly looking into getting a job. Thinks his depression is getting a bit  worse; usually bad every few months but now staying depressed more often. Will stay depressed for a few weeks then feel better for a week and then depressed again. Will feel sad or down or anxious, losing interest in things. Same thing every day, will play on his computer, talk  to friends and go back to bed. May try to walk dog more often; is getting no physical exercise. Sleep schedule has recently gotten better, has improved to 2a bed time instead of 6a and waking up at [place]. Ms. [name] and he haven't looked into autism society [place] resources. No reason for not doing. Ms. [name] adds that she has been having a lot of health issues and she thinks that this is some of where his depressed mood has been coming from. Ever since she passed out on the front porch before going to the hospital, she thinks he stays worried about her and more depressed. He was home alone for 3 days while she was in the hospital. He frequently checks on her but she is doing much better. Cris is unable to access his emotion as this is discussed. If something were to happen, her niece would step in to care for [name]. Reviewed hydroxyzine which he had been taking every morning. Will renew prescriptions. Gets paranoid that people are watching him. Upset is still random and not happening as much because he is only playing video games. Eating 1.5 meals per day reviewed exercise.   Visit Diagnosis:    ICD-10-CM   1. Autism  F84.0 cloNIDine (CATAPRES) 0.2 MG tablet    2. Attention deficit hyperactivity disorder (ADHD), other type  F90.8 cloNIDine (CATAPRES) 0.2 MG tablet    buPROPion (WELLBUTRIN XL) 300 MG 24 hr tablet    3. Generalized anxiety disorder  F41.1 busPIRone (BUSPAR) 10 MG tablet    buPROPion (WELLBUTRIN XL) 300 MG 24 hr tablet    hydrOXYzine (ATARAX) 25 MG tablet    4. Mild episode of recurrent major depressive disorder (HCC)  F33.0 buPROPion (WELLBUTRIN XL) 300 MG 24 hr tablet       Past Psychiatric History:  Diagnoses: autism spectrum disorder, ADHD, generalized anxiety, major depression, generalized idopathic epilepsy Medication trials: yes but current medication is effective for calm. Without it difficult. Ritalin made angry and hyperactive and didn't eat. Prozac like zombie, sertraline sounds  familiar.  Previous psychiatrist/therapist: yes since kindergarten and worked closely with [facility]. Hospitalizations: none Suicide attempts: none SIB: none Hx of violence towards others: in defense or when standing too close Current access to guns: none Hx of abuse: none that he is aware of Substance use: none  Past Medical History:  Past Medical History:  Diagnosis Date   ADHD (attention deficit hyperactivity disorder)    Anxiety and depression 11/13/2020   Autism    OCD (obsessive compulsive disorder)    Seizures ([place])    focal   Separation anxiety     Past Surgical History:  Procedure Laterality Date   TONSILLECTOMY      Family Psychiatric History: unknown-adopted  Family History:  Family History  Adopted: Yes  Problem Relation Age of Onset   Cancer Neg Hx    Clotting disorder Neg Hx    Rheumatologic disease Neg Hx     Social History:  Social History   Socioeconomic History   Marital status: Single    Spouse name: Not on file   Number of children: Not on file   Years of education: Not on file  Highest education level: Not on file  Occupational History   Not on file  Tobacco Use   Smoking status: Never   Smokeless tobacco: Never  Vaping Use   Vaping Use: Never used  Substance and Sexual Activity   Alcohol use: Not Currently    Alcohol/week: 1.0 standard drink of alcohol    Types: 1 Standard drinks or equivalent per week    Comment: infrequent socially   Drug use: Never   Sexual activity: Never  Other Topics Concern   Not on file  Social History Narrative   Right handed      Highest level of edu- 12th grade      Lives with mom   One story home 5 steps into home   Caff. sodas   Social Determinants of Health   Financial Resource Strain: Low Risk  (12/14/2020)   Overall Financial Resource Strain (CARDIA)    Difficulty of Paying Living Expenses: Not hard at all  Food Insecurity: No Food Insecurity (10/25/2021)   Hunger Vital Sign    Worried  About Running Out of Food in the Last Year: Never true    Ran Out of Food in the Last Year: Never true  Transportation Needs: No Transportation Needs (10/25/2021)   PRAPARE - Hydrologist (Medical): No    Lack of Transportation (Non-Medical): No  Physical Activity: Insufficiently Active (10/25/2021)   Exercise Vital Sign    Days of Exercise per Week: 1 day    Minutes of Exercise per Session: 20 min  Stress: No Stress Concern Present (12/14/2020)   [place]anda    Feeling of Stress : Not at all  Social Connections: Socially Isolated (12/14/2020)   Social Connection and Isolation Panel [NHANES]    Frequency of Communication with Friends and Family: More than three times a week    Frequency of Social Gatherings with Friends and Family: More than three times a week    Attends Religious Services: Never    Marine scientist or Organizations: No    Attends Music therapist: Never    Marital Status: Never married    Allergies: No Known Allergies  Current Medications: Current Outpatient Medications  Medication Sig Dispense Refill   acetaminophen (TYLENOL) 500 MG tablet Take 1 tablet (500 mg total) by mouth every 6 (six) hours as needed. 30 tablet 0   buPROPion (WELLBUTRIN XL) 300 MG 24 hr tablet Take 1 tablet (300 mg total) by mouth daily. 30 tablet 2   busPIRone (BUSPAR) 10 MG tablet Take 1 tablet (10 mg total) by mouth 2 (two) times daily. 60 tablet 2   cloNIDine (CATAPRES) 0.2 MG tablet Take 1 tablet (0.2 mg total) by mouth at bedtime. 30 tablet 2   hydrOXYzine (ATARAX) 25 MG tablet Take 1 tablet (25 mg total) by mouth 2 (two) times daily as needed for anxiety. 60 tablet 2   levETIRAcetam (KEPPRA) 1000 MG tablet Take 1 and 1/2 tablets twice a day 270 tablet 3   lisinopril (ZESTRIL) 10 MG tablet TAKE 1 TABLET BY MOUTH EVERY DAY FOR BLOOD PRESSURE 90 tablet 2   loratadine (CLARITIN) 10  MG tablet TAKE 1 TABLET BY MOUTH EVERY DAY FOR ALLERGIES 90 tablet 0   No current facility-administered medications for this visit.    ROS: Review of Systems  Constitutional:  Negative for appetite change and unexpected weight change.  Neurological:  Negative for seizures.  Psychiatric/Behavioral:  Positive for decreased concentration and sleep disturbance. Negative for dysphoric mood, hallucinations, self-injury and suicidal ideas. The patient is not nervous/anxious.     Objective:  Psychiatric Specialty Exam: There were no vitals taken for this visit.There is no height or weight on file to calculate BMI.  General Appearance: Casual, Fairly Groomed, and wearing glasses. Appears stated age  Eye Contact:  Minimal  Speech:  Clear and Coherent, Normal Rate, and overall one to two word answers with short sentence structure  Volume:  Normal  Mood:   "All right"  Affect:  Appropriate, Congruent, Constricted, and Depressed  Thought Content: Logical and Hallucinations: None   Suicidal Thoughts:  No  Homicidal Thoughts:  No  Thought Process:  Goal Directed and concrete  Orientation:  Full (Time, Place, and Person)    Memory:  Immediate;   Poor Recent;   Poor Remote;   Fair  Judgment:  Fair; limited at baseline  Insight:  [place]; limited at baseline  Concentration:  Concentration: Fair and Attention Span: Fair  Recall:  Poor; limited at baseline  Fund of Knowledge: Fair  Language: Fair  Psychomotor Activity:  Normal  Akathisia:  No  AIMS (if indicated): not done  Assets:  Desire for Improvement Financial Resources/Insurance Housing Leisure Time Physical Health Resilience Social Support Transportation  ADL's:  Intact  Cognition: WNL  Sleep:  Poor but improving   PE: General: sits comfortably in view of camera; no acute distress  Pulm: no increased work of breathing on room air MSK: all extremity movements appear intact  Neuro: no focal neurological deficits observed  Gait  & Station: unable to assess by video    Metabolic Disorder Labs: No results found for: "HGBA1C", "MPG" No results found for: "PROLACTIN" Lab Results  Component Value Date   CHOL 148 09/20/2021   TRIG 98 09/20/2021   HDL 35 (L) 09/20/2021   CHOLHDL 4.2 09/20/2021   [place] 95 09/20/2021   Lab Results  Component Value Date   TSH 2.320 09/20/2021    Therapeutic Level Labs: No results found for: "LITHIUM" No results found for: "VALPROATE" No results found for: "CBMZ"  Screenings:  GAD-7    Flowsheet Row Office Visit from 04/10/2022 in [facility] from 01/30/2022 in [facility] at [place]  Total GAD-7 Score 13 7      PHQ2-9    [facility] Office Visit from 04/10/2022 in [facility] from 01/30/2022 in [facility] at [place] from 12/17/2021 in [facility] Video Visit from 11/15/2021 in [facility] at [place] from 09/19/2021 in [facility]  PHQ-2 Total Score '5 6 2 '$ 0 3  PHQ-9 Total Score 20 18 -- -- 8      Flowsheet Row Counselor from 01/30/2022 in [facility] at [facility] from 11/15/2021 in [facility] at [facility] [facility] from 09/21/2020 in [facility] at [place] No Risk No Risk No Risk       Collaboration of Care: Collaboration of Care: Referral or follow-up with counselor/therapist AEB psychotherapy referral  Patient/Guardian was advised Release of Information must be obtained prior to any record release in order to collaborate their care with an outside provider. Patient/Guardian was advised if they have not already done so to contact the registration department to sign all necessary forms in order  for [place] to release information regarding their  care.   Consent: Patient/Guardian gives verbal consent for treatment and assignment of benefits for services provided during this visit. Patient/Guardian expressed understanding and agreed to proceed.   Televisit via video: I connected with Renner on 05/02/22 at 10:00 AM EDT by a video enabled telemedicine application and verified that I am speaking with the correct person using two identifiers.  Location: Patient: home Provider: home office   I discussed the limitations of evaluation and management by telemedicine and the availability of in person appointments. The patient expressed understanding and agreed to proceed.  I discussed the assessment and treatment plan with the patient. The patient was provided an opportunity to ask questions and all were answered. The patient agreed with the plan and demonstrated an understanding of the instructions.   The patient was advised to call back or seek an in-person evaluation if the symptoms worsen or if the condition fails to improve as anticipated.  I provided 15 minutes of non-face-to-face time during this encounter.  Jacquelynn Cree, MD 05/02/2022, 10:25 AM

## 2022-05-14 IMAGING — DX DG HAND COMPLETE 3+V*R*
3 series · 3 of 3 positions shown · non-contrast
Comparison: 10/23/2013

CLINICAL DATA: Acute RIGHT hand pain following injury today.
Initial encounter.

EXAM:
RIGHT HAND - COMPLETE 3+ VIEW

[hand pa]
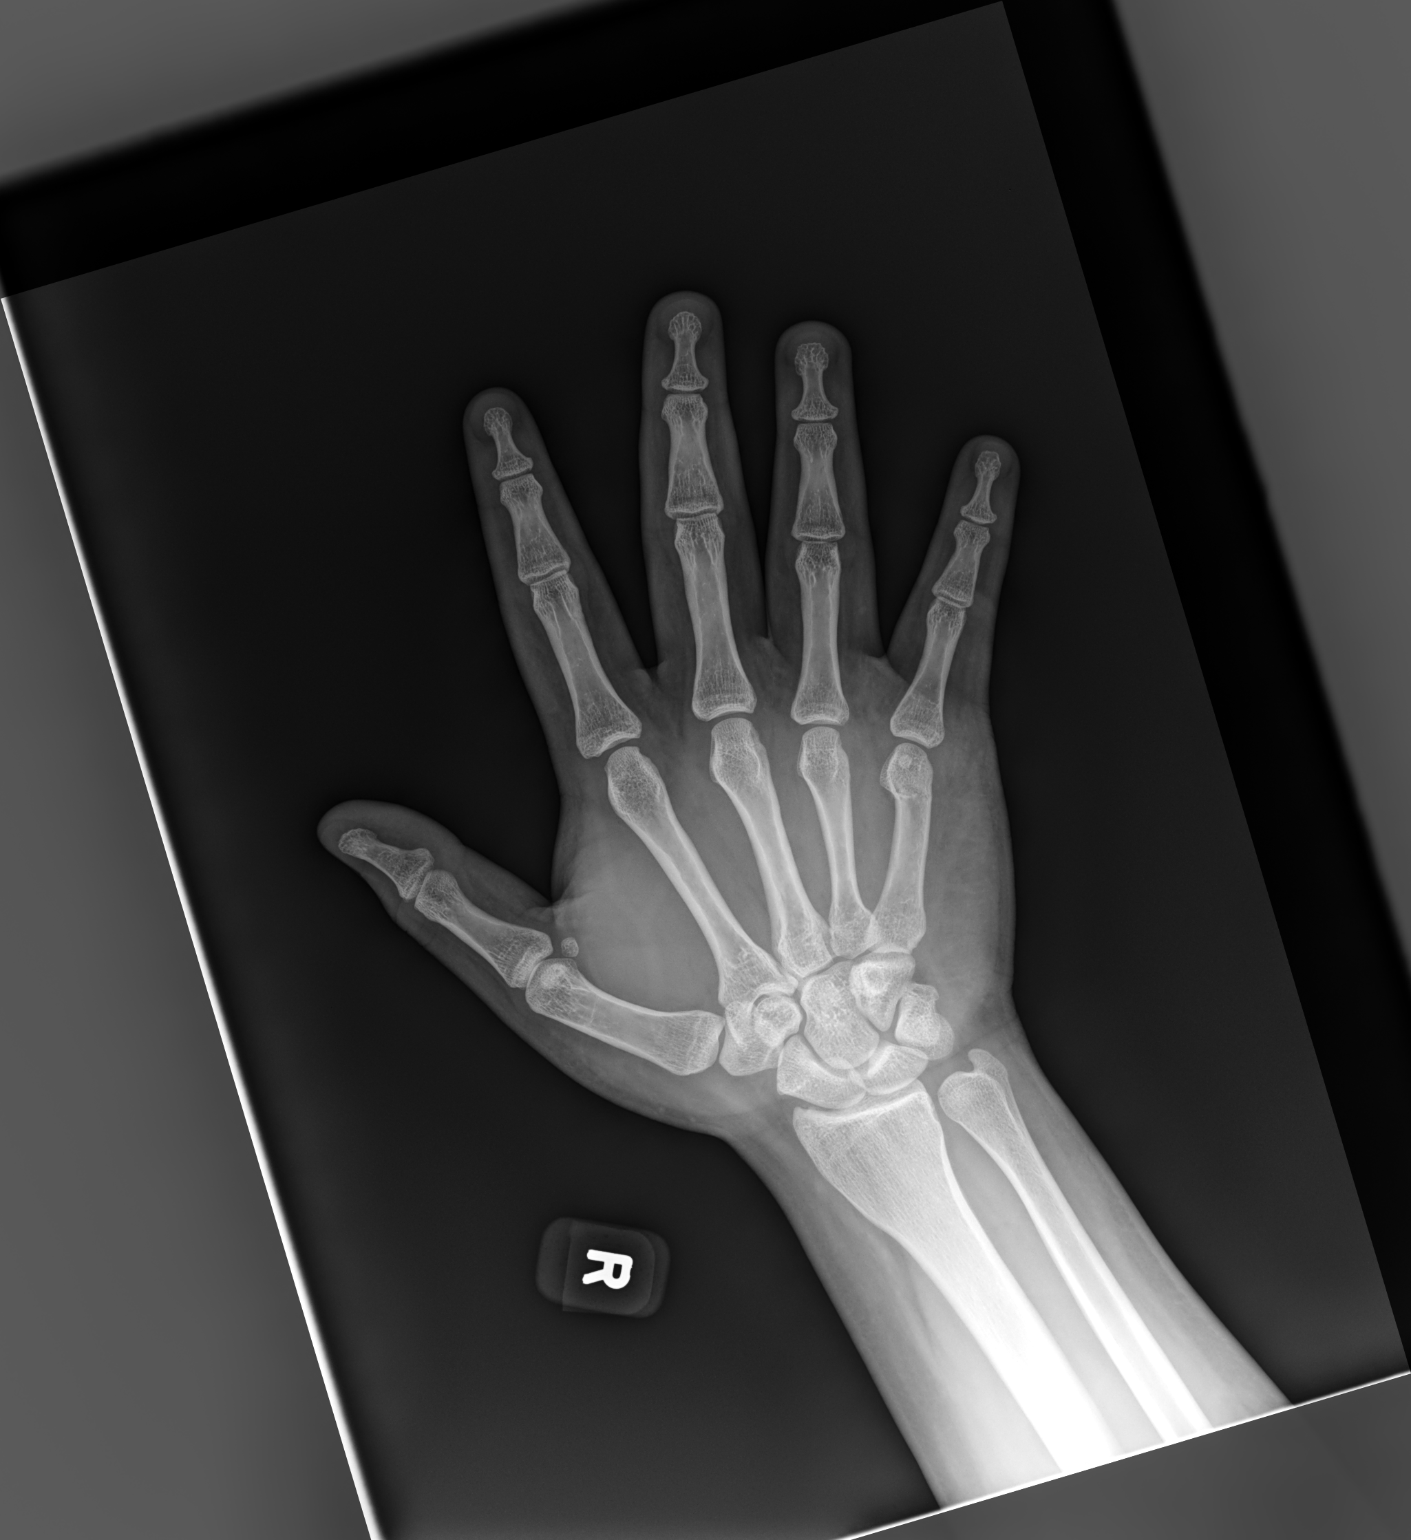

[hand mlo]
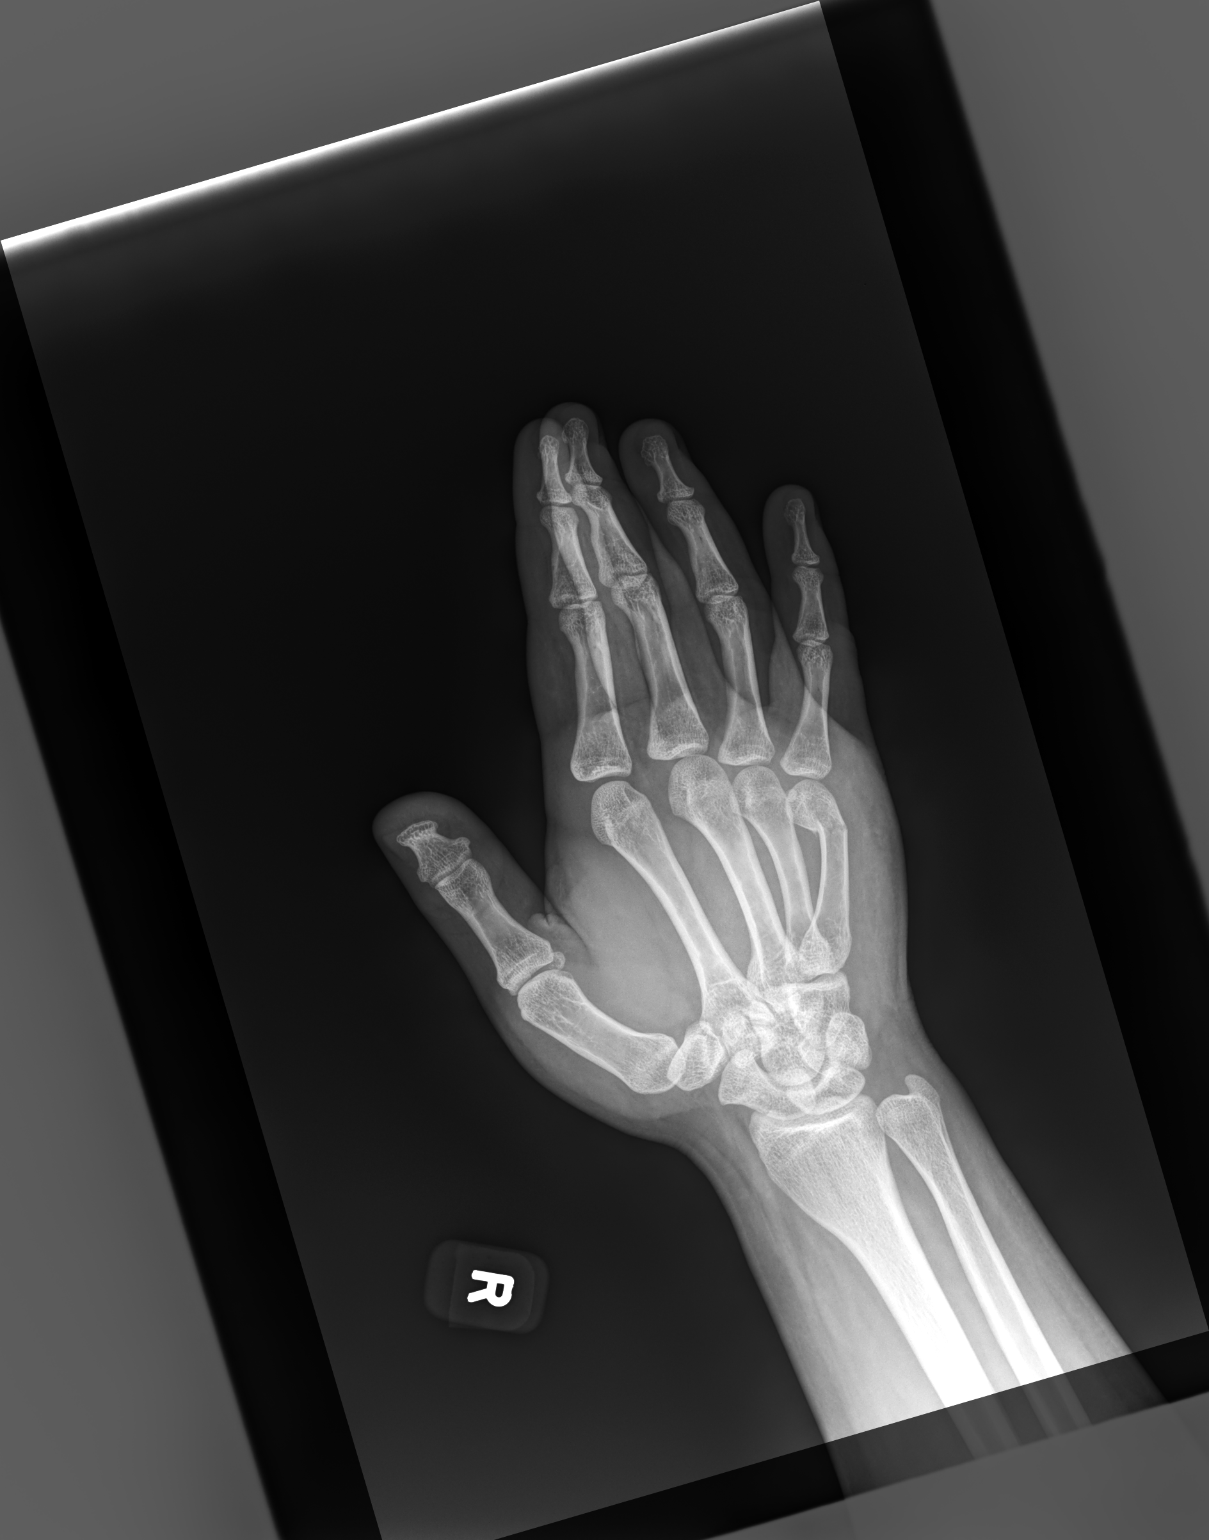

[hand lat]
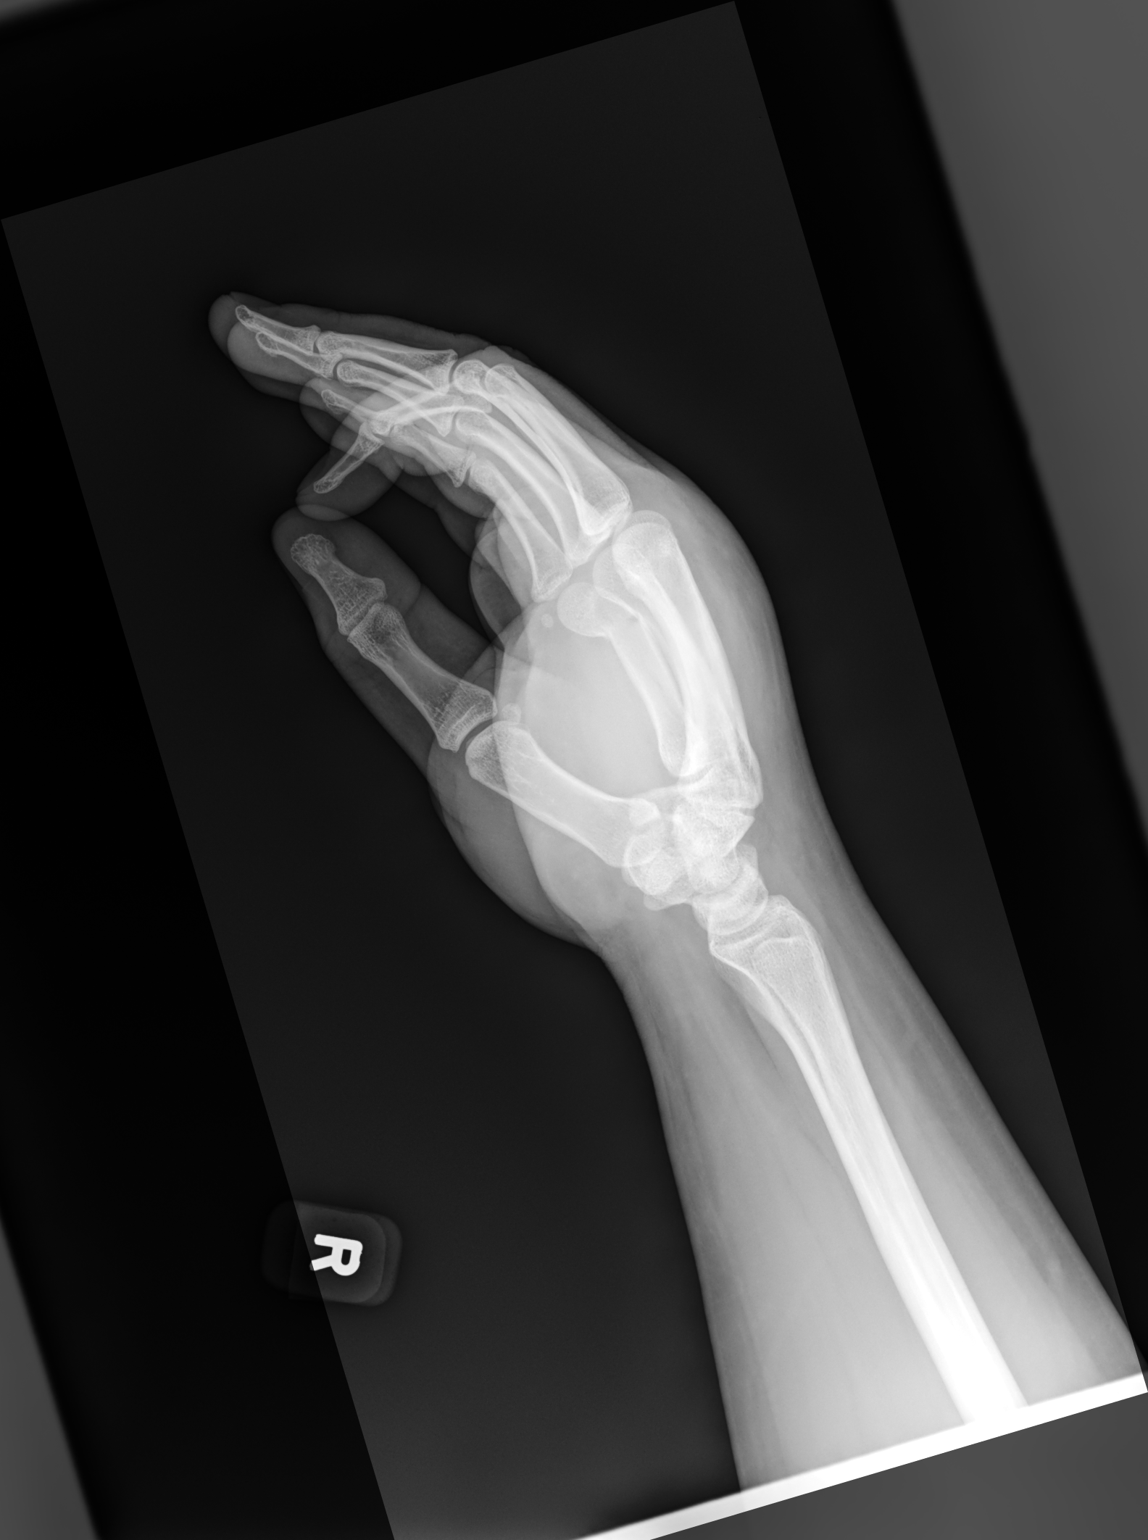

[3 of 3 positions shown; findings below may reference images not displayed]

FINDINGS: A nondisplaced fracture of the distal 5th metacarpal is noted with
apex dorsal angulation. Associated soft tissue swelling is noted.

No other fracture, subluxation or dislocation identified.
IMPRESSION: Nondisplaced fracture of the distal 5th metacarpal with apex dorsal
angulation.

## 2022-06-17 ENCOUNTER — Other Ambulatory Visit: Payer: Self-pay | Admitting: Nurse Practitioner

## 2022-07-02 ENCOUNTER — Encounter (HOSPITAL_COMMUNITY): Payer: Self-pay | Admitting: Psychiatry

## 2022-07-02 ENCOUNTER — Telehealth (INDEPENDENT_AMBULATORY_CARE_PROVIDER_SITE_OTHER): Payer: Medicare HMO | Admitting: Psychiatry

## 2022-07-02 DIAGNOSIS — F330 Major depressive disorder, recurrent, mild: Secondary | ICD-10-CM

## 2022-07-02 DIAGNOSIS — F908 Attention-deficit hyperactivity disorder, other type: Secondary | ICD-10-CM

## 2022-07-02 DIAGNOSIS — F840 Autistic disorder: Secondary | ICD-10-CM

## 2022-07-02 DIAGNOSIS — F411 Generalized anxiety disorder: Secondary | ICD-10-CM

## 2022-07-02 MED ORDER — HYDROXYZINE HCL 25 MG PO TABS
25.0000 mg | ORAL_TABLET | Freq: Two times a day (BID) | ORAL | 5 refills | Status: DC | PRN
Start: 2022-07-02 — End: 2023-01-06

## 2022-07-02 MED ORDER — BUPROPION HCL ER (XL) 300 MG PO TB24: 300.0000 mg | ORAL_TABLET | Freq: Every day | ORAL | 5 refills | Status: DC

## 2022-07-02 MED ORDER — BUSPIRONE HCL 10 MG PO TABS: 10.0000 mg | ORAL_TABLET | Freq: Two times a day (BID) | ORAL | 5 refills | Status: DC

## 2022-07-02 MED ORDER — CLONIDINE HCL 0.2 MG PO TABS: 0.2000 mg | ORAL_TABLET | Freq: Every day | ORAL | 5 refills | Status: DC

## 2022-07-02 NOTE — Progress Notes (Signed)
BH MD Outpatient Progress Note  07/02/2022 9:56 AM Joshua Sanchez  MRN:  [id number]  Assessment:  Joshua Sanchez presents for follow-up evaluation. Today, 07/02/22, patient reports overall stability of mood in the setting of maintenance of extremely sedentary lifestyle primarily watching YouTube and playing videos.  His caretaker, Joshua Sanchez, agrees that no medication changes are warranted at this time given this information and we will continue to focus on behavioral modification of becoming more active.  To that end he has made no progress in contacting autism Society and he admits that he does not want to be around people as reason for avoiding.  With his autism do not suspect there will be significant change in the rigidity of his thought over time.  We will continue to strongly encouraged him to reach out to the autism Society for vocational rehab and to begin to do much more regular daily exercise and getting outdoors.  He continues in psychotherapy. As in last appointment, due to lack of general structure to his day, will have individual days where he does not sleep and will stay up playing video games. Reviewed general sleep hygiene with patient and his mother. Follow up in 6 months.  Identifying Information: Joshua Sanchez is a 22 y.o. 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 Initial presentation on 11/15/21, see that note for full case formulation. Mood symptoms noted to be well controlled on combination of wellbutrin, buspar, and hydroxyzine as needed at that time. He has had formal testing for autism and ADHD after he was adopted at the age of 89 and in interaction with Joshua Sanchez, concurred with these diagnosis. His adoptive mother, Joshua Sanchez, was present for much of the  interview and able to fill in gaps around his history. His main psychiatric symptom was lower frustration tolerance around social interactions and some rigidity of behaviors. Did discuss wellbutrin use with neurology in between visits and they are ok with continuing wellbutrin at current dosage with regard to seizure risk.    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   Subjective:  Chief Complaint:  Chief Complaint  Patient presents with   Autism   Follow-up   Anxiety   Depression    Interval History: Link sent to [phone].   Doing fine. Just got home after taking his dog for a walk. Stopped looking for a job and never looked into touching base with the autism society. Has an issue but doesn't want to disclose at this time. Does share that he feels depressed but doesn't have any motivation to do anything besides get on  his computer and watch YouTube. Finds that this is keeping him distracted rather than enjoyable. Denies doing any other activities besides this and walking his dog. Rarely will read a book on the computer. His adoptive mother wants him to start  gardening again. Joshua Sanchez has already planted the garden. Again encouraged him to reach out to autism society. Has been going to sleep at 4a or 5a again because he isn't feeling as tired from not being active. Anxiety has ups and downs as before. Ms Sander Sanchez adds that he isn't as angry as he used to be and calmer. She has hopes that once he is working with her in the garden this will help him mentally because he takes pride in what he is doing. He adds that he is avoiding autism society because he doesn't want to be around people.   Visit Diagnosis:    ICD-10-CM   1. Generalized anxiety disorder  F41.1 buPROPion (WELLBUTRIN XL) 300 MG 24 hr tablet    busPIRone (BUSPAR) 10 MG tablet    hydrOXYzine (ATARAX) 25 MG tablet    2. Mild episode of recurrent major depressive disorder (HCC)  F33.0 buPROPion (WELLBUTRIN XL) 300 MG 24 hr tablet    3. Attention deficit hyperactivity disorder (ADHD), other type  F90.8 buPROPion (WELLBUTRIN XL) 300 MG 24 hr tablet    cloNIDine (CATAPRES) 0.2 MG tablet    4. Autism  F84.0 cloNIDine (CATAPRES) 0.2 MG tablet       Past Psychiatric History:  Diagnoses: autism spectrum disorder, ADHD, generalized anxiety, major depression, generalized idopathic epilepsy Medication trials: yes but current medication is effective for calm. Without it difficult. Ritalin made angry and hyperactive and didn't eat. Prozac like zombie, sertraline sounds familiar.  Previous psychiatrist/therapist: yes since kindergarten and worked closely with Joshua Sanchez. Hospitalizations: none Suicide attempts: none SIB: none Hx of violence towards others: in defense or when standing too close Current access to guns: none Hx of abuse: none that he is aware of Substance use: none  Past Medical History:  Past Medical History:  Diagnosis Date   ADHD (attention deficit hyperactivity disorder)    Anxiety and depression 11/13/2020   Autism    OCD (obsessive compulsive disorder)    Seizures (HCC)     focal   Separation anxiety     Past Surgical History:  Procedure Laterality Date   TONSILLECTOMY      Family Psychiatric History: unknown-adopted  Family History:  Family History  Adopted: Yes  Problem Relation Age of Onset   Cancer Neg Hx    Clotting disorder Neg Hx    Rheumatologic disease Neg Hx     Social History:  Social History   Socioeconomic History   Marital status: Single    Spouse name: Not on file   Number of children: Not on file   Years of education: Not on file   Highest education level: Not on file  Occupational History   Not on file  Tobacco Use   Smoking status: Never   Smokeless tobacco: Never  Vaping Use   Vaping Use: Never used  Substance and Sexual Activity   Alcohol use: Not Currently    Alcohol/week: 1.0 standard drink of alcohol    Types: 1 Standard drinks or equivalent per week    Comment: infrequent socially   Drug use: Never   Sexual activity: Never  Other Topics Concern   Not on file  Social History Narrative   Right handed  Highest level of edu- 12th grade      Lives with mom   One story home 5 steps into home   Caff. sodas   Social Determinants of Sanchez   Financial Resource Strain: Low Risk  (12/14/2020)   Overall Financial Resource Strain (CARDIA)    Difficulty of Paying Living Expenses: Not hard at all  Food Insecurity: No Food Insecurity (10/25/2021)   Hunger Vital Sign    Worried About Running Out of Food in the Last Year: Never true    Ran Out of Food in the Last Year: Never true  Transportation Needs: No Transportation Needs (10/25/2021)   PRAPARE - Administrator, Civil Service (Medical): No    Lack of Transportation (Non-Medical): No  Physical Activity: Insufficiently Active (10/25/2021)   Exercise Vital Sign    Days of Exercise per Week: 1 day    Minutes of Exercise per Session: 20 min  Stress: No Stress Concern Present (12/14/2020)   [employer] of Occupational Sanchez - Occupational  Stress Questionnaire    Feeling of Stress : Not at all  Social Connections: Socially Isolated (12/14/2020)   Social Connection and Isolation Panel [NHANES]    Frequency of Communication with Friends and Family: More than three times a week    Frequency of Social Gatherings with Friends and Family: More than three times a week    Attends Religious Services: Never    Database administrator or Organizations: No    Attends Engineer, structural: Never    Marital Status: Never married    Allergies: No Known Allergies  Current Medications: Current Outpatient Medications  Medication Sig Dispense Refill   acetaminophen (TYLENOL) 500 MG tablet Take 1 tablet (500 mg total) by mouth every 6 (six) hours as needed. 30 tablet 0   buPROPion (WELLBUTRIN XL) 300 MG 24 hr tablet Take 1 tablet (300 mg total) by mouth daily. 30 tablet 5   busPIRone (BUSPAR) 10 MG tablet Take 1 tablet (10 mg total) by mouth 2 (two) times daily. 60 tablet 5   cloNIDine (CATAPRES) 0.2 MG tablet Take 1 tablet (0.2 mg total) by mouth at bedtime. 30 tablet 5   hydrOXYzine (ATARAX) 25 MG tablet Take 1 tablet (25 mg total) by mouth 2 (two) times daily as needed for anxiety. 60 tablet 5   levETIRAcetam (KEPPRA) 1000 MG tablet Take 1 and 1/2 tablets twice a day 270 tablet 3   lisinopril (ZESTRIL) 10 MG tablet TAKE 1 TABLET BY MOUTH EVERY DAY FOR BLOOD PRESSURE 90 tablet 2   loratadine (CLARITIN) 10 MG tablet TAKE 1 TABLET BY MOUTH EVERY DAY FOR ALLERGIES 90 tablet 0   No current facility-administered medications for this visit.    ROS: Review of Systems  Constitutional:  Negative for appetite change and unexpected weight change.  Neurological:  Negative for seizures.  Psychiatric/Behavioral:  Positive for decreased concentration and sleep disturbance. Negative for dysphoric mood, hallucinations, self-injury and suicidal ideas. The patient is not nervous/anxious.     Objective:  Psychiatric Specialty Exam: There were  no vitals taken for this visit.There is no height or weight on file to calculate BMI.  General Appearance: Casual, Fairly Groomed, and wearing glasses. Appears stated age  Eye Contact:  Minimal  Speech:  Clear and Coherent, Normal Rate, and overall one to two word answers with short sentence structure  Volume:  Normal  Mood:   "All right"  Affect:  Appropriate, Congruent, Constricted, and Depressed but brighter than  last appointment  Thought Content: Logical and Hallucinations: None   Suicidal Thoughts:  No  Homicidal Thoughts:  No  Thought Process:  Goal Directed and concrete  Orientation:  Full (Time, Place, and Person)    Memory:  Immediate;   Poor Recent;   Poor Remote;   Fair  Judgment:  Fair; limited at baseline  Insight:  Fair; limited at baseline  Concentration:  Concentration: Fair and Attention Span: Fair  Recall:  Poor; limited at baseline  Fund of Knowledge: Fair  Language: Fair  Psychomotor Activity:  Normal  Akathisia:  No  AIMS (if indicated): not done  Assets:  Desire for Improvement Financial Resources/Insurance Housing Leisure Time Physical Sanchez Resilience Social Support Transportation  ADL's:  Intact  Cognition: WNL  Sleep:  Poor    PE: General: sits comfortably in view of camera; no acute distress  Pulm: no increased work of breathing on room air MSK: all extremity movements appear intact  Neuro: no focal neurological deficits observed  Gait & Station: unable to assess by video    Metabolic Disorder Labs: No results found for: "HGBA1C", "MPG" No results found for: "PROLACTIN" Lab Results  Component Value Date   CHOL 148 09/20/2021   TRIG 98 09/20/2021   HDL 35 (L) 09/20/2021   CHOLHDL 4.2 09/20/2021   LDLCALC 95 09/20/2021   Lab Results  Component Value Date   TSH 2.320 09/20/2021    Therapeutic Level Labs: No results found for: "LITHIUM" No results found for: "VALPROATE" No results found for: "CBMZ"  Screenings:  GAD-7     Flowsheet Row Office Visit from 04/10/2022 in [facility] Counselor from 01/30/2022 in [facility] [place]  Total GAD-7 Score 13 7      PHQ2-9    Flowsheet Row Office Visit from 04/10/2022 in [facility] Counselor from 01/30/2022 in [facility] [place] Office Visit from 12/17/2021 in [facility] Video Visit from 11/15/2021 in [facility] [place] Office Visit from 09/19/2021 in [facility]  PHQ-2 Total Score 5 6 2  0 3  PHQ-9 Total Score 20 18 -- -- 8      Flowsheet Row Counselor from 01/30/2022 in [facility] [place] Video Visit from 11/15/2021 in [facility] [place] ED from 09/21/2020 in [facility] [place]  C-SSRS RISK CATEGORY No Risk No Risk No Risk       Collaboration of Care: Collaboration of Care: Referral or follow-up with counselor/therapist AEB psychotherapy referral  Patient/Guardian was advised Release of Information must be obtained prior to any record release in order to collaborate their care with an outside provider. Patient/Guardian was advised if they have not already done so to contact the registration department to sign all necessary forms in order for [place] to release information regarding their care.   Consent: Patient/Guardian gives verbal consent for treatment and assignment of benefits for services provided during this visit. Patient/Guardian expressed understanding and agreed to proceed.   Televisit via video: I connected with Joshua Sanchez on 07/02/22 at  9:30 AM EDT by a video enabled telemedicine application and verified that I am speaking with the correct person using two identifiers.  Location: Patient: home Provider: home office   I discussed the limitations of evaluation and  management by telemedicine and the availability of in person appointments. The patient expressed understanding and  agreed to proceed.  I discussed the assessment and treatment plan with the patient. The patient was provided an opportunity to ask questions and all were answered. The patient agreed with the plan and demonstrated an understanding of the instructions.   The patient was advised to call back or seek an in-person evaluation if the symptoms worsen or if the condition fails to improve as anticipated.  I provided 15 minutes of non-face-to-face time during this encounter.  Elsie Lincoln, MD 07/02/2022, 9:56 AM

## 2022-07-03 ENCOUNTER — Encounter (HOSPITAL_COMMUNITY): Payer: Self-pay | Admitting: *Deleted

## 2022-07-26 ENCOUNTER — Ambulatory Visit: Payer: Medicare Other | Admitting: Neurology

## 2022-07-30 ENCOUNTER — Encounter: Payer: Self-pay | Admitting: Neurology

## 2022-07-30 ENCOUNTER — Telehealth (INDEPENDENT_AMBULATORY_CARE_PROVIDER_SITE_OTHER): Payer: Medicare HMO | Admitting: Neurology

## 2022-07-30 VITALS — Ht 69.0 in | Wt 230.0 lb

## 2022-07-30 DIAGNOSIS — G40309 Generalized idiopathic epilepsy and epileptic syndromes, not intractable, without status epilepticus: Secondary | ICD-10-CM

## 2022-07-30 MED ORDER — LEVETIRACETAM 1000 MG PO TABS: ORAL_TABLET | ORAL | 3 refills | Status: DC

## 2022-07-30 NOTE — Patient Instructions (Signed)
Continue Keppra 1000mg : Take 1 and 1/2 tablets twice a day. Follow-up in 6 months, call for any changes. Let me know if any seizures, we may increase medication dose if needed.   Seizure Precautions: 1. If medication has been prescribed for you to prevent seizures, take it exactly as directed.  Do not stop taking the medicine without talking to your doctor first, even if you have not had a seizure in a long time.   2. Avoid activities in which a seizure would cause danger to yourself or to others.  Don't operate dangerous machinery, swim alone, or climb in high or dangerous places, such as on ladders, roofs, or girders.  Do not drive unless your doctor says you may.  3. If you have any warning that you may have a seizure, lay down in a safe place where you can't hurt yourself.    4.  No driving for 6 months from last seizure, as per [facility].   Please refer to the following link on the Epilepsy Foundation of America's website for more information: http://www.epilepsyfoundation.org/answerplace/Social/driving/drivingu.cfm   5.  Maintain good sleep hygiene. Avoid alcohol.  6.  Contact your doctor if you have any problems that may be related to the medicine you are taking.  7.  Call 911 and bring the patient back to the ED if:        A.  The seizure lasts longer than 5 minutes.       B.  The patient doesn't awaken shortly after the seizure  C.  The patient has new problems such as difficulty seeing, speaking or moving  D.  The patient was injured during the seizure  E.  The patient has a temperature over 102 F (39C)  F.  The patient vomited and now is having trouble breathing

## 2022-07-30 NOTE — Progress Notes (Signed)
Virtual Visit via Video Note The purpose of this virtual visit is to provide medical care while limiting exposure to the novel coronavirus.    Consent was obtained for video visit:  Yes.   Answered questions that patient had about telehealth interaction:  Yes.   I discussed the limitations, risks, security and privacy concerns of performing an evaluation and management service by telemedicine. I also discussed with the patient that there may be a patient responsible charge related to this service. The patient expressed understanding and agreed to proceed.  Pt location: Home Physician Location: office Name of referring provider:  Billie Lade, MD I connected with Joshua Sanchez at patients initiation/request on 07/30/2022 at  3:30 PM EDT by video enabled telemedicine application and verified that I am speaking with the correct person using two identifiers. Pt MRN:  [id number] Pt DOB:  2000/09/23 Video Participants:  Joshua Sanchez;  Joshua Sanchez (mother)   History of Present Illness:  The patient had a virtual video visit on 07/30/2022. He was last seen in the neurology clinic 6 months ago for seizures. Since his last visit, they report one seizure the week before Christmas 2024. He felt like he could not keep his head up then woke up on his chair. He bit his tongue, no incontinence or focal weakness. His mother reports the convulsion lasted 6-7 minutes. He states he does not sleep well at night, he goes to bed at 3am but gets 8 hours of sleep. He sometimes would stare off and would respond "50/50" when called. He previously reported having episodes of a metallic taste, this has not occurred recently. He does not think he missed any medications. No alcohol use. He has headaches every once in a while. He denies any dizziness, vision changes, no side effects on Levetiracetam 1500mg  BID. He sees Psychiatry and has not had any changes in medications, he is on Wellbutrin 300mg  daily. Mood is "up and  down," he is grumpy in the morning.    History on Initial Assessment 12/01/2018: This is an 22 year old right-handed man with a history of autism spectrum disorder, ADHD, anxiety, presenting for evaluation of seizures. His mother reports a diagnosis of "focal seizures" when he started having episodes of bilateral hand shaking at age 41. He would drop things from his hand. One time he got dizzy with the shaking, no associated speech changes or confusion. He was having them at school where teachers would call his mother and tell him he "did not seem right" and was unable to focus after he alerted them about the hand shaking. Shaking would last 6-7 minutes. His mother reports having an EEG in [facility] where "they did not find anything" and he was not started on medication. He was having these episodes once a month, no clear triggers. On 11/01/2018, he recalls waking up feeling fine and taking a shower, then waking up in the ambulance. He had bitten his lip and felt diffusely weak with a headache. His mother had heard him fall and came to the bathroom to find him with body jerking, head banging on the floor. She thinks he had urinary incontinence. She tried to get him up and he smacked her, confused. She feels his left side was weaker. He was brought to [facility] where CBC, CMP, UDS were normal. I personally reviewed head CT without contrast which was unremarkable. He was discharged home on Levetiracetam 500mg  BID. No further convulsions since then, but he had one episode of  hand shaking yesterday where he felt funny/uneasy. He feels tired on the Levetiracetam. He reports headaches for the past 4 years occurring every other day, mostly on the left side with pressure and throbbing. Pain can last all day sometimes, with sensitivity to sound. No nausea/vomiting. He usually lays down if headaches are mild, and would take an Ibuprofen sometimes. He denies any dizziness, diplopia, dysarthria/dysphagia, neck pain,  bowel/bladder dysfunction. His mother denies any staring/unresponsive episodes. He denies any gaps in time, olfactory/gustatory hallucinations. Over the past 2 years, he has had sleep difficulties, awake at night and asleep during the day. He may take melatonin or a sleep aid to help sleep at night. His mother administers medications. He does not drive.  He was adopted at age 25, diagnosed with autism spectrum disorder, ADHD, anxiety at age 69 or 65. As far as his mother knows, he had a normal birth and early development, no febrile convulsions, CNS infections, family history of seizures.   Diagnostic Data: MRI brain with and without contrast done 12/2018 which did not show any acute changes, hippocampi symmetric with no abnormal signal or enhancement seen.  1-hour wake and sleep EEG in 02/2019 was normal    Current Outpatient Medications on File Prior to Visit  Medication Sig Dispense Refill   acetaminophen (TYLENOL) 500 MG tablet Take 1 tablet (500 mg total) by mouth every 6 (six) hours as needed. 30 tablet 0   buPROPion (WELLBUTRIN XL) 300 MG 24 hr tablet Take 1 tablet (300 mg total) by mouth daily. 30 tablet 5   busPIRone (BUSPAR) 10 MG tablet Take 1 tablet (10 mg total) by mouth 2 (two) times daily. 60 tablet 5   cloNIDine (CATAPRES) 0.2 MG tablet Take 1 tablet (0.2 mg total) by mouth at bedtime. 30 tablet 5   hydrOXYzine (ATARAX) 25 MG tablet Take 1 tablet (25 mg total) by mouth 2 (two) times daily as needed for anxiety. 60 tablet 5   levETIRAcetam (KEPPRA) 1000 MG tablet Take 1 and 1/2 tablets twice a day 270 tablet 3   lisinopril (ZESTRIL) 10 MG tablet TAKE 1 TABLET BY MOUTH EVERY DAY FOR BLOOD PRESSURE 90 tablet 2   loratadine (CLARITIN) 10 MG tablet TAKE 1 TABLET BY MOUTH EVERY DAY FOR ALLERGIES 90 tablet 0   No current facility-administered medications on file prior to visit.     Observations/Objective:   Vitals:   07/30/22 1329  Weight: 230 lb (104.3 kg)  Height: 5\' 9"  (1.753  m)   GEN:  The patient appears stated age and is in NAD.  Neurological examination: Patient is awake, alert. No aphasia or dysarthria. Intact fluency and comprehension.Cranial nerves: Extraocular movements intact. No facial asymmetry. Motor: moves all extremities symmetrically, at least anti-gravity x 4.    Assessment and Plan:   This is a 22 yo RH man with a history of autism spectrum disorder, ADHD, anxiety, with generalized convulsions, seizure-free for over 2 years until he had 2 seizures in April 2023 in the setting of missing medication and sleep deprivation. They report a seizure in December 2023, possibly due to sleep dysfunction. Continue Levetiracetam 1500mg  BID, refills sent. We discussed avoidance of seizure triggers. He is aware of [place] driving laws to stop driving until 6 months seizure-free. Follow-up in 6 months, he knows to contact our office in the event of another seizure.    Follow Up Instructions:   -I discussed the assessment and treatment plan with the patient. The patient was provided an opportunity to  ask questions and all were answered. The patient agreed with the plan and demonstrated an understanding of the instructions.   The patient was advised to call back or seek an in-person evaluation if the symptoms worsen or if the condition fails to improve as anticipated.     Van Clines, MD

## 2022-08-19 ENCOUNTER — Other Ambulatory Visit: Payer: Self-pay | Admitting: Internal Medicine

## 2022-10-09 ENCOUNTER — Ambulatory Visit (INDEPENDENT_AMBULATORY_CARE_PROVIDER_SITE_OTHER): Payer: Medicare HMO | Admitting: Internal Medicine

## 2022-10-09 ENCOUNTER — Encounter: Payer: Self-pay | Admitting: Internal Medicine

## 2022-10-09 VITALS — BP 131/84 | HR 92 | Wt 257.0 lb

## 2022-10-09 DIAGNOSIS — Z114 Encounter for screening for human immunodeficiency virus [HIV]: Secondary | ICD-10-CM

## 2022-10-09 DIAGNOSIS — R569 Unspecified convulsions: Secondary | ICD-10-CM

## 2022-10-09 DIAGNOSIS — I10 Essential (primary) hypertension: Secondary | ICD-10-CM | POA: Diagnosis not present

## 2022-10-09 DIAGNOSIS — E669 Obesity, unspecified: Secondary | ICD-10-CM

## 2022-10-09 DIAGNOSIS — Z0001 Encounter for general adult medical examination with abnormal findings: Secondary | ICD-10-CM

## 2022-10-09 DIAGNOSIS — Z23 Encounter for immunization: Secondary | ICD-10-CM | POA: Diagnosis not present

## 2022-10-09 DIAGNOSIS — F330 Major depressive disorder, recurrent, mild: Secondary | ICD-10-CM

## 2022-10-09 DIAGNOSIS — F840 Autistic disorder: Secondary | ICD-10-CM | POA: Diagnosis not present

## 2022-10-09 NOTE — Assessment & Plan Note (Signed)
Denies seizure activity since late December 2023.  Followed by neurology, recently seen for follow-up.  Remains on Keppra 1500 mg twice daily.

## 2022-10-09 NOTE — Assessment & Plan Note (Signed)
Annual physical exam completed today.  Available records and labs have been reviewed. -Repeat labs ordered today, including one-time HIV screening -Tdap vaccine administered today -We will tentatively plan for routine follow-up in 6 months

## 2022-10-09 NOTE — Patient Instructions (Signed)
It was a pleasure to see you today.  Thank you for giving [place] the opportunity to be involved in your care.  Below is a brief recap of your visit and next steps.  We will plan to see you again in 6 months.  Summary Annual exam completed today Repeat labs ordered Tetanus vaccine administered Follow up in 6 months

## 2022-10-09 NOTE — Progress Notes (Signed)
Complete physical exam  Patient: Joshua Sanchez   DOB: [birth year]   21 y.o. Male  MRN: [id number]  Subjective:    Chief Complaint  Patient presents with   Annual Exam    Annual physical     Joshua Sanchez is a 22 y.o. male who presents today for a complete physical exam. He reports consuming a general diet. The patient does not participate in regular exercise at present. He generally feels fairly well. He reports sleeping poorly. He does not have additional problems to discuss today.    Most recent fall risk assessment:    10/09/2022    2:56 PM  Fall Risk   Falls in the past year? 0     Most recent depression screenings:    10/09/2022    2:56 PM 04/10/2022    3:43 PM  PHQ 2/9 Scores  PHQ - 2 Score 4 5  PHQ- 9 Score 15 20    Vision:Within last year and Dental: Current dental problems and Receives regular dental care  Past Medical History:  Diagnosis Date   ADHD (attention deficit hyperactivity disorder)    Anxiety and depression 11/13/2020   Autism    OCD (obsessive compulsive disorder)    Seizures (HCC)    focal   Separation anxiety    Past Surgical History:  Procedure Laterality Date   TONSILLECTOMY     Social History   Tobacco Use   Smoking status: Never   Smokeless tobacco: Never  Vaping Use   Vaping status: Never Used  Substance Use Topics   Alcohol use: Not Currently    Alcohol/week: 1.0 standard drink of alcohol    Types: 1 Standard drinks or equivalent per week    Comment: infrequent socially   Drug use: Never   Family History  Adopted: Yes  Problem Relation Age of Onset   Cancer Neg Hx    Clotting disorder Neg Hx    Rheumatologic disease Neg Hx    No Known Allergies   Patient Care Team: Billie Lade, MD as PCP - General (Internal Medicine) Van Clines, MD as Consulting Physician (Neurology) Elsie Lincoln, MD as Consulting Physician (Psychiatry)   Outpatient Medications Prior to Visit  Medication Sig   acetaminophen  (TYLENOL) 500 MG tablet Take 1 tablet (500 mg total) by mouth every 6 (six) hours as needed.   buPROPion (WELLBUTRIN XL) 300 MG 24 hr tablet Take 1 tablet (300 mg total) by mouth daily.   busPIRone (BUSPAR) 10 MG tablet Take 1 tablet (10 mg total) by mouth 2 (two) times daily.   cloNIDine (CATAPRES) 0.2 MG tablet Take 1 tablet (0.2 mg total) by mouth at bedtime.   hydrOXYzine (ATARAX) 25 MG tablet Take 1 tablet (25 mg total) by mouth 2 (two) times daily as needed for anxiety.   levETIRAcetam (KEPPRA) 1000 MG tablet Take 1 and 1/2 tablets twice a day   lisinopril (ZESTRIL) 10 MG tablet TAKE 1 TABLET BY MOUTH EVERY DAY FOR BLOOD PRESSURE   loratadine (CLARITIN) 10 MG tablet TAKE 1 TABLET BY MOUTH EVERY DAY FOR ALLERGIES   No facility-administered medications prior to visit.   Review of Systems  Constitutional:  Negative for chills and fever.  HENT:  Negative for sore throat.   Respiratory:  Negative for cough and shortness of breath.   Cardiovascular:  Negative for chest pain, palpitations and leg swelling.  Gastrointestinal:  Negative for abdominal pain, blood in stool, constipation, diarrhea, nausea and vomiting.  Genitourinary:  Negative for dysuria and hematuria.  Musculoskeletal:  Negative for myalgias.  Skin:  Negative for itching and rash.  Neurological:  Negative for dizziness and headaches.  Psychiatric/Behavioral:  Negative for depression and suicidal ideas.       Objective:     BP 131/84 (BP Location: Left Arm, Patient Position: Sitting, Cuff Size: Normal)   Pulse 92   Wt 257 lb (116.6 kg)   SpO2 94%   BMI 37.95 kg/m  BP Readings from Last 3 Encounters:  10/09/22 131/84  04/10/22 113/78  01/28/22 135/83   Physical Exam Vitals reviewed.  Constitutional:      General: He is not in acute distress.    Appearance: Normal appearance. He is obese. He is not ill-appearing.  HENT:     Head: Normocephalic and atraumatic.     Right Ear: External ear normal.     Left Ear:  External ear normal.     Nose: Nose normal. No congestion or rhinorrhea.     Mouth/Throat:     Mouth: Mucous membranes are moist.     Pharynx: Oropharynx is clear.  Eyes:     General: No scleral icterus.    Extraocular Movements: Extraocular movements intact.     Conjunctiva/sclera: Conjunctivae normal.     Pupils: Pupils are equal, round, and reactive to light.  Cardiovascular:     Rate and Rhythm: Normal rate and regular rhythm.     Pulses: Normal pulses.     Heart sounds: Normal heart sounds. No murmur heard. Pulmonary:     Effort: Pulmonary effort is normal.     Breath sounds: Normal breath sounds. No wheezing, rhonchi or rales.  Abdominal:     General: Abdomen is flat. Bowel sounds are normal. There is no distension.     Palpations: Abdomen is soft.     Tenderness: There is no abdominal tenderness.  Musculoskeletal:        General: No swelling or deformity. Normal range of motion.     Cervical back: Normal range of motion.  Skin:    General: Skin is warm and dry.     Capillary Refill: Capillary refill takes less than 2 seconds.  Neurological:     General: No focal deficit present.     Mental Status: He is alert and oriented to person, place, and time.     Motor: No weakness.  Psychiatric:        Mood and Affect: Mood normal.        Behavior: Behavior normal.        Thought Content: Thought content normal.    Last CBC Lab Results  Component Value Date   WBC 9.1 09/20/2021   HGB 14.6 09/20/2021   HCT 45.7 09/20/2021   MCV 79 09/20/2021   MCH 25.3 (L) 09/20/2021   RDW 14.3 09/20/2021   PLT 209 09/20/2021   Last metabolic panel Lab Results  Component Value Date   GLUCOSE 88 06/06/2021   NA 143 06/06/2021   K 4.5 06/06/2021   CL 102 06/06/2021   CO2 24 06/06/2021   BUN 7 06/06/2021   CREATININE 1.25 06/06/2021   EGFR 84 06/06/2021   CALCIUM 9.7 06/06/2021   PROT 7.5 06/06/2021   ALBUMIN 4.3 06/06/2021   LABGLOB 3.2 06/06/2021   AGRATIO 1.3 06/06/2021    BILITOT 0.3 06/06/2021   ALKPHOS 89 06/06/2021   AST 20 06/06/2021   ALT 26 06/06/2021   ANIONGAP 9 11/01/2018   Last lipids Lab Results  Component  Value Date   CHOL 148 09/20/2021   HDL 35 (L) 09/20/2021   LDLCALC 95 09/20/2021   TRIG 98 09/20/2021   CHOLHDL 4.2 09/20/2021   Last thyroid functions Lab Results  Component Value Date   TSH 2.320 09/20/2021       Assessment & Plan:    Routine Health Maintenance and Physical Exam  Immunization History  Administered Date(s) Administered   Influenza,inj,Quad PF,6+ Mos 12/07/2017, 12/14/2018, 04/10/2022   Influenza-Unspecified 11/21/2020   Moderna Sars-Covid-2 Vaccination 07/05/2019, 08/05/2019   Tdap 10/09/2022    Health Maintenance  Topic Date Due   HPV VACCINES (1 - Male 3-dose series) Never done   COVID-19 Vaccine (3 - 2023-24 season) 10/19/2021   INFLUENZA VACCINE  09/19/2022   Medicare Annual Wellness (AWV)  12/18/2022   DTaP/Tdap/Td (2 - Td or Tdap) 10/08/2032   Hepatitis C Screening  Completed   HIV Screening  Completed    Discussed health benefits of physical activity, and encouraged him to engage in regular exercise appropriate for his age and condition.  Problem List Items Addressed This Visit       Hypertension    BP remains well-controlled on current antihypertensive regimen.  No medication changes are indicated today.      Seizures (HCC)    Denies seizure activity since late December 2023.  Followed by neurology, recently seen for follow-up.  Remains on Keppra 1500 mg twice daily.      Mild episode of recurrent major depressive disorder (HCC)    Followed by psychiatry, recently seen for follow-up.  Mood remains stable on Wellbutrin, BuSpar, and hydroxyzine.  No medication changes are indicated today.      Encounter for well adult exam with abnormal findings    Annual physical exam completed today.  Available records and labs have been reviewed. -Repeat labs ordered today, including one-time HIV  screening -Tdap vaccine administered today -We will tentatively plan for routine follow-up in 6 months      Need for Tdap vaccination    Tdap vaccine administered today      Return in about 6 months (around 04/11/2023).  Billie Lade, MD

## 2022-10-09 NOTE — Assessment & Plan Note (Signed)
Followed by psychiatry, recently seen for follow-up.  Mood remains stable on Wellbutrin, BuSpar, and hydroxyzine.  No medication changes are indicated today.

## 2022-10-09 NOTE — Assessment & Plan Note (Signed)
BP remains well-controlled on current antihypertensive regimen.  No medication changes are indicated today.

## 2022-10-09 NOTE — Assessment & Plan Note (Signed)
-   Tdap vaccine administered today.

## 2022-10-10 ENCOUNTER — Other Ambulatory Visit: Payer: Self-pay | Admitting: Internal Medicine

## 2022-10-10 DIAGNOSIS — E559 Vitamin D deficiency, unspecified: Secondary | ICD-10-CM

## 2022-10-10 MED ORDER — VITAMIN D (ERGOCALCIFEROL) 1.25 MG (50000 UNIT) PO CAPS: 50000.0000 [IU] | ORAL_CAPSULE | ORAL | 0 refills | Status: AC

## 2022-10-11 LAB — B12 AND FOLATE PANEL
Folate: 6.2 ng/mL (ref >3.0)
Vitamin B-12: 472 pg/mL (ref 232–1245)

## 2022-10-11 LAB — CMP14+EGFR
ALT: 28 IU/L (ref 0–44)
AST: 19 IU/L (ref 0–40)
Albumin: 4.3 g/dL (ref 4.3–5.2)
Alkaline Phosphatase: 83 IU/L (ref 44–121)
BUN/Creatinine Ratio: 10 (ref 9–20)
BUN: 12 mg/dL (ref 6–20)
Bilirubin Total: 0.4 mg/dL (ref 0.0–1.2)
CO2: 21 mmol/L (ref 20–29)
Calcium: 9.6 mg/dL (ref 8.7–10.2)
Chloride: 101 mmol/L (ref 96–106)
Creatinine, Ser: 1.17 mg/dL (ref 0.76–1.27)
Globulin, Total: 2.9 g/dL (ref 1.5–4.5)
Glucose: 82 mg/dL (ref 70–99)
Potassium: 4.2 mmol/L (ref 3.5–5.2)
Sodium: 140 mmol/L (ref 134–144)
Total Protein: 7.2 g/dL (ref 6.0–8.5)
eGFR: 90 mL/min/{1.73_m2} (ref >59)

## 2022-10-11 LAB — CBC WITH DIFFERENTIAL/PLATELET
Basophils Absolute: 0 10*3/uL (ref 0.0–0.2)
Basos: 0 %
EOS (ABSOLUTE): 0.3 10*3/uL (ref 0.0–0.4)
Eos: 3 %
Hematocrit: 47.8 % (ref 37.5–51.0)
Hemoglobin: 15.5 g/dL (ref 13.0–17.7)
Immature Grans (Abs): 0 10*3/uL (ref 0.0–0.1)
Immature Granulocytes: 0 %
Lymphocytes Absolute: 3.1 10*3/uL (ref 0.7–3.1)
Lymphs: 30 %
MCH: 25.9 pg — ABNORMAL LOW (ref 26.6–33.0)
MCHC: 32.4 g/dL (ref 31.5–35.7)
MCV: 80 fL (ref 79–97)
Monocytes Absolute: 0.8 10*3/uL (ref 0.1–0.9)
Monocytes: 8 %
Neutrophils Absolute: 6 10*3/uL (ref 1.4–7.0)
Neutrophils: 59 %
Platelets: 223 10*3/uL (ref 150–450)
RBC: 5.99 x10E6/uL — ABNORMAL HIGH (ref 4.14–5.80)
RDW: 13.9 % (ref 11.6–15.4)
WBC: 10.1 10*3/uL (ref 3.4–10.8)

## 2022-10-11 LAB — TSH+FREE T4
Free T4: 1.19 ng/dL (ref 0.82–1.77)
TSH: 1.9 u[IU]/mL (ref 0.450–4.500)

## 2022-10-11 LAB — LIPID PANEL
Chol/HDL Ratio: 4 ratio (ref 0.0–5.0)
Cholesterol, Total: 163 mg/dL (ref 100–199)
HDL: 41 mg/dL (ref >39)
LDL Chol Calc (NIH): 106 mg/dL — ABNORMAL HIGH (ref 0–99)
Triglycerides: 82 mg/dL (ref 0–149)
VLDL Cholesterol Cal: 16 mg/dL (ref 5–40)

## 2022-10-11 LAB — HIV ANTIBODY (ROUTINE TESTING W REFLEX): HIV Screen 4th Generation wRfx: NONREACTIVE

## 2022-10-11 LAB — HEMOGLOBIN A1C
Est. average glucose Bld gHb Est-mCnc: 108 mg/dL
Hgb A1c MFr Bld: 5.4 % (ref 4.8–5.6)

## 2022-10-11 LAB — VITAMIN D 25 HYDROXY (VIT D DEFICIENCY, FRACTURES): Vit D, 25-Hydroxy: 11.2 ng/mL — ABNORMAL LOW (ref 30.0–100.0)

## 2022-12-29 ENCOUNTER — Other Ambulatory Visit: Payer: Self-pay | Admitting: Internal Medicine

## 2023-01-02 ENCOUNTER — Telehealth (HOSPITAL_COMMUNITY): Payer: Medicare HMO | Admitting: Psychiatry

## 2023-01-06 ENCOUNTER — Encounter (HOSPITAL_COMMUNITY): Payer: Self-pay | Admitting: Psychiatry

## 2023-01-06 ENCOUNTER — Telehealth (INDEPENDENT_AMBULATORY_CARE_PROVIDER_SITE_OTHER): Payer: Medicare HMO | Admitting: Psychiatry

## 2023-01-06 DIAGNOSIS — F411 Generalized anxiety disorder: Secondary | ICD-10-CM

## 2023-01-06 DIAGNOSIS — F908 Attention-deficit hyperactivity disorder, other type: Secondary | ICD-10-CM | POA: Diagnosis not present

## 2023-01-06 DIAGNOSIS — F840 Autistic disorder: Secondary | ICD-10-CM | POA: Diagnosis not present

## 2023-01-06 DIAGNOSIS — F330 Major depressive disorder, recurrent, mild: Secondary | ICD-10-CM

## 2023-01-06 MED ORDER — CLONIDINE HCL 0.2 MG PO TABS
0.2000 mg | ORAL_TABLET | Freq: Every day | ORAL | 5 refills | Status: DC
Start: 2023-01-06 — End: 2023-04-28

## 2023-01-06 MED ORDER — HYDROXYZINE HCL 25 MG PO TABS
25.0000 mg | ORAL_TABLET | Freq: Two times a day (BID) | ORAL | 5 refills | Status: DC | PRN
Start: 2023-01-06 — End: 2023-04-28

## 2023-01-06 MED ORDER — BUPROPION HCL ER (XL) 300 MG PO TB24
300.0000 mg | ORAL_TABLET | Freq: Every day | ORAL | 5 refills | Status: DC
Start: 2023-01-06 — End: 2023-04-28

## 2023-01-06 MED ORDER — BUSPIRONE HCL 10 MG PO TABS
10.0000 mg | ORAL_TABLET | Freq: Two times a day (BID) | ORAL | 5 refills | Status: DC
Start: 2023-01-06 — End: 2023-04-28

## 2023-01-06 NOTE — Patient Instructions (Signed)
We did not make any medication changes today.  Please continue to reach out to the autism Society of [place] for further resources.

## 2023-01-06 NOTE — Progress Notes (Signed)
BH MD Outpatient Progress Note  01/06/2023 2:02 PM Joshua Sanchez  MRN:  [id number]  Assessment:  Joshua Sanchez presents for follow-up evaluation. Today, 01/06/23, patient reports overall stability of mood in the setting of maintenance of extremely sedentary lifestyle primarily watching YouTube and playing videogames.  His caretaker, Joshua Sanchez, agrees that no medication changes are warranted at this time given this information and we will continue to focus on behavioral modification of becoming more active.  To that end he did manage to get his sleep cycle back to 10 PM to 7 AM sleep hours but due to delays in contacting  autism Society missed the window to get enrolled in full classes. With his autism do not suspect there will be significant change in the rigidity of his thought over time.  We will continue to strongly encouraged him to reach out to the autism Society for vocational rehab and to begin to do much more regular daily exercise and getting outdoors.  He continues in psychotherapy. Follow up in 6 months.  Identifying Information: Joshua Sanchez is a 22 y.o. 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 Initial presentation on 11/15/21, see that note for full case formulation. Mood symptoms noted to be well controlled on combination of wellbutrin, buspar, and hydroxyzine as needed at that time. He has had formal testing for autism and ADHD after he was adopted at the age of 65 and in interaction with Joshua Sanchez, concurred with these diagnosis. His adoptive mother, Joshua Sanchez, was present for much of the interview and able to fill in gaps around his history. His main psychiatric symptom was lower frustration tolerance around social interactions and some rigidity of behaviors. Did  discuss wellbutrin use with neurology in between visits and they are ok with continuing wellbutrin at current dosage with regard to seizure risk.    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 Karel Sanchez  Patient was given contact information for behavioral health clinic and was instructed to call 911 for emergencies.   Subjective:  Chief Complaint:  Chief Complaint  Patient presents with   Autism   Anxiety   Depression   Follow-up   Panic Attack    Interval History: Link sent to [phone].   Has been going outside more and walking his dog more. Mood has been pretty good, not finding any mood swings lately. 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 Described as a free class online not through a school. Able to focus when he needs to. Mainly having back pain from how he sleeps and sits in chairs all day. Thinks medications adequate and finally fixed his sleep schedule by going to bed by 10p  and waking up at 7a. When he remembers to eat, will eat 1-73meals per day but frequently forgets to eat entirely, twice per week.  Still no seizures.  Joshua Sanchez has no concerns and likes that he has improved his sleep schedule. Still is avoiding autism society because he  doesn't want to be around people but Joshua Sanchez says he did reach out but classes were full.   Visit Diagnosis:    ICD-10-CM   1. Autism  F84.0 cloNIDine (CATAPRES) 0.2 MG tablet    2. Generalized anxiety disorder  F41.1 buPROPion (WELLBUTRIN XL) 300 MG 24 hr tablet    busPIRone (BUSPAR) 10 MG tablet    hydrOXYzine (ATARAX) 25 MG tablet    3. Mild episode of recurrent major depressive disorder (HCC)  F33.0 buPROPion (WELLBUTRIN XL) 300 MG 24 hr tablet    4. Other specified attention deficit hyperactivity disorder (ADHD)  F90.8 buPROPion (WELLBUTRIN XL) 300 MG 24 hr tablet    cloNIDine (CATAPRES) 0.2 MG tablet        Past Psychiatric History:  Diagnoses: autism spectrum disorder, ADHD, generalized anxiety, major depression, generalized idopathic epilepsy Medication trials: yes but current medication is effective for calm. Without it difficult. Ritalin made angry and hyperactive and didn't eat. Prozac like zombie, sertraline sounds familiar.  Previous psychiatrist/therapist: yes since kindergarten and worked closely with [facility]. Hospitalizations: none Suicide attempts: none SIB: none Hx of violence towards others: in defense or when standing too close Current access to guns: none Hx of abuse: none that he is aware of Substance use: none  Past Medical History:  Past Medical History:  Diagnosis Date   ADHD (attention deficit hyperactivity disorder)    Anxiety and depression 11/13/2020   Autism    OCD (obsessive compulsive disorder)    Seizures (HCC)    focal   Separation anxiety     Past Surgical History:  Procedure Laterality Date   TONSILLECTOMY      Family Psychiatric History: unknown-adopted  Family History:  Family History  Adopted: Yes  Problem Relation Age of Onset   Cancer Neg Hx    Clotting disorder Neg Hx    Rheumatologic disease Neg Hx     Social History:  Social History   Socioeconomic History   Marital status: Single    Spouse name: Not on  file   Number of children: Not on file   Years of education: Not on file   Highest education level: Not on file  Occupational History   Not on file  Tobacco Use   Smoking status: Never   Smokeless tobacco: Never  Vaping Use   Vaping status: Never Used  Substance and Sexual Activity   Alcohol use: Not Currently    Alcohol/week: 1.0 standard drink of alcohol    Types: 1 Standard drinks or equivalent per week    Comment: infrequent socially   Drug use: Never   Sexual activity: Never  Other Topics Concern   Not on file  Social History Narrative   Right handed      Highest level of edu- 12th grade      Lives with mom   One story home 5 steps into home   Caff. sodas   Social Determinants of Health   Financial Resource Strain: Low Risk  (12/14/2020)   Overall Financial Resource Strain (CARDIA)    Difficulty of Paying Living Expenses: Not hard at all  Food Insecurity: No Food Insecurity (10/25/2021)   Hunger Vital Sign  Worried About Programme researcher, broadcasting/film/video in the Last Year: Never true    Ran Out of Food in the Last Year: Never true  Transportation Needs: No Transportation Needs (10/25/2021)   PRAPARE - Administrator, Civil Service (Medical): No    Lack of Transportation (Non-Medical): No  Physical Activity: Insufficiently Active (10/25/2021)   Exercise Vital Sign    Days of Exercise per Week: 1 day    Minutes of Exercise per Session: 20 min  Stress: No Stress Concern Present (12/14/2020)   [employer] of Occupational Health - Occupational Stress Questionnaire    Feeling of Stress : Not at all  Social Connections: Socially Isolated (12/14/2020)   Social Connection and Isolation Panel [NHANES]    Frequency of Communication with Friends and Family: More than three times a week    Frequency of Social Gatherings with Friends and Family: More than three times a week    Attends Religious Services: Never    Database administrator or Organizations: No    Attends Probation officer: Never    Marital Status: Never married    Allergies: No Known Allergies  Current Medications: Current Outpatient Medications  Medication Sig Dispense Refill   acetaminophen (TYLENOL) 500 MG tablet Take 1 tablet (500 mg total) by mouth every 6 (six) hours as needed. 30 tablet 0   buPROPion (WELLBUTRIN XL) 300 MG 24 hr tablet Take 1 tablet (300 mg total) by mouth daily. 30 tablet 5   busPIRone (BUSPAR) 10 MG tablet Take 1 tablet (10 mg total) by mouth 2 (two) times daily. 60 tablet 5   cloNIDine (CATAPRES) 0.2 MG tablet Take 1 tablet (0.2 mg total) by mouth at bedtime. 30 tablet 5   hydrOXYzine (ATARAX) 25 MG tablet Take 1 tablet (25 mg total) by mouth 2 (two) times daily as needed for anxiety. 60 tablet 5   levETIRAcetam (KEPPRA) 1000 MG tablet Take 1 and 1/2 tablets twice a day 270 tablet 3   lisinopril (ZESTRIL) 10 MG tablet TAKE 1 TABLET BY MOUTH EVERY DAY FOR BLOOD PRESSURE 90 tablet 2   loratadine (CLARITIN) 10 MG tablet TAKE 1 TABLET BY MOUTH EVERY DAY FOR ALLERGIES 90 tablet 0   No current facility-administered medications for this visit.    ROS: Review of Systems  Constitutional:  Negative for appetite change and unexpected weight change.  Neurological:  Negative for seizures.  Psychiatric/Behavioral:  Negative for decreased concentration, dysphoric mood, hallucinations, self-injury, sleep disturbance and suicidal ideas. The patient is not nervous/anxious.     Objective:  Psychiatric Specialty Exam: There were no vitals taken for this visit.There is no height or weight on file to calculate BMI.  General Appearance: Casual, Fairly Groomed, and wearing glasses. Appears stated age  Eye Contact:  Minimal  Speech:  Clear and Coherent, Normal Rate, and overall one to two word answers with short sentence structure  Volume:  Normal  Mood:   "Good"  Affect:  Appropriate, Congruent, and Constricted but brighter than last appointment  Thought Content:  Logical and Hallucinations: None   Suicidal Thoughts:  No  Homicidal Thoughts:  No  Thought Process:  Goal Directed and concrete  Orientation:  Full (Time, Place, and Person)    Memory:  Immediate;   Poor  Judgment:  Fair; limited at baseline  Insight:  Fair; limited at baseline  Concentration:  Concentration: Poor  Recall:  Poor; limited at baseline  Fund of Knowledge: Fair  Language: Fair  Psychomotor  Activity:  Normal  Akathisia:  No  AIMS (if indicated): not done  Assets:  Desire for Improvement Financial Resources/Insurance Housing Leisure Time Physical Health Resilience Social Support Transportation  ADL's:  Intact  Cognition: WNL  Sleep:  Fair    PE: General: sits comfortably in view of camera; no acute distress  Pulm: no increased work of breathing on room air MSK: all extremity movements appear intact  Neuro: no focal neurological deficits observed  Gait & Station: unable to assess by video    Metabolic Disorder Labs: Lab Results  Component Value Date   HGBA1C 5.4 10/09/2022   No results found for: "PROLACTIN" Lab Results  Component Value Date   CHOL 163 10/09/2022   TRIG 82 10/09/2022   HDL 41 10/09/2022   CHOLHDL 4.0 10/09/2022   LDLCALC 106 (H) 10/09/2022   LDLCALC 95 09/20/2021   Lab Results  Component Value Date   TSH 1.900 10/09/2022   TSH 2.320 09/20/2021    Therapeutic Level Labs: No results found for: "LITHIUM" No results found for: "VALPROATE" No results found for: "CBMZ"  Screenings:  GAD-7    Flowsheet Row Office Visit from 10/09/2022 in [facility] Office Visit from 04/10/2022 in [facility] Primary [facility] Counselor from 01/30/2022 in [facility] at [place]  Total GAD-7 Score 10 13 7       PHQ2-9    Flowsheet Row Office Visit from 10/09/2022 in [facility] Primary [facility] Office Visit from 04/10/2022 in [facility] Primary Care Counselor from  01/30/2022 in [facility] at [place] Office Visit from 12/17/2021 in [facility] Video Visit from 11/15/2021 in [facility] at [facility] Total Score 4 5 6 2  0  PHQ-9 Total Score 15 20 18  -- --      Flowsheet Row Counselor from 01/30/2022 in [place] [facility] at [place] Video Visit from 11/15/2021 in [facility] at [place] [facility] from 09/21/2020 in [facility] Urgent [facility] at [place]  C-SSRS RISK CATEGORY No Risk No Risk No Risk       Collaboration of Care: Collaboration of Care: Referral or follow-up with counselor/therapist AEB psychotherapy referral  Patient/Guardian was advised Release of Information must be obtained prior to any record release in order to collaborate their care with an outside provider. Patient/Guardian was advised if they have not already done so to contact the registration department to sign all necessary forms in order for [place] to release information regarding their care.   Consent: Patient/Guardian gives verbal consent for treatment and assignment of benefits for services provided during this visit. Patient/Guardian expressed understanding and agreed to proceed.   Televisit via video: I connected with Joshua Sanchez on 01/06/23 at  2:00 PM EST by a video enabled telemedicine application and verified that I am speaking with the correct person using two identifiers.  Location: Patient: home Provider: home office   I discussed the limitations of evaluation and management by telemedicine and the availability of in person appointments. The patient expressed understanding and agreed to proceed.  I discussed the assessment and treatment plan with the patient. The patient was provided an opportunity to ask questions and all were answered. The patient agreed with the plan and demonstrated an understanding of the instructions.   The patient  was advised to call back or seek an in-person evaluation if the symptoms worsen or if the condition fails to improve  as anticipated.  I provided 10 minutes of virtual face-to-face time during this encounter including gathering collateral from patient's adoptive mother.  Elsie Lincoln, MD 01/06/2023, 2:02 PM

## 2023-02-02 ENCOUNTER — Other Ambulatory Visit: Payer: Self-pay | Admitting: Internal Medicine

## 2023-02-04 ENCOUNTER — Other Ambulatory Visit: Payer: Self-pay | Admitting: Neurology

## 2023-02-21 ENCOUNTER — Encounter: Payer: Self-pay | Admitting: Neurology

## 2023-02-21 ENCOUNTER — Ambulatory Visit (INDEPENDENT_AMBULATORY_CARE_PROVIDER_SITE_OTHER): Payer: 59 | Admitting: Neurology

## 2023-02-21 VITALS — BP 130/83 | HR 102 | Ht 69.0 in | Wt 260.0 lb

## 2023-02-21 DIAGNOSIS — G40309 Generalized idiopathic epilepsy and epileptic syndromes, not intractable, without status epilepticus: Secondary | ICD-10-CM

## 2023-02-21 MED ORDER — LEVETIRACETAM 1000 MG PO TABS: ORAL_TABLET | ORAL | 4 refills | Status: DC

## 2023-02-21 NOTE — Patient Instructions (Signed)
 Good to see you. I hope you feel better soon! Continue Keppra  (Levetiracetam ) 1000mg : take 1 and 1/2 tablets twice a day. Follow-up in 6 months, call for any changes   Seizure Precautions: 1. If medication has been prescribed for you to prevent seizures, take it exactly as directed.  Do not stop taking the medicine without talking to your doctor first, even if you have not had a seizure in a long time.   2. Avoid activities in which a seizure would cause danger to yourself or to others.  Don't operate dangerous machinery, swim alone, or climb in high or dangerous places, such as on ladders, roofs, or girders.  Do not drive unless your doctor says you may.  3. If you have any warning that you may have a seizure, lay down in a safe place where you can't hurt yourself.    4.  No driving for 6 months from last seizure, as per [place]  state law.   Please refer to the following link on the Epilepsy Foundation of America's website for more information: http://www.epilepsyfoundation.org/answerplace/Social/driving/drivingu.cfm   5.  Maintain good sleep hygiene.  6.  Contact your doctor if you have any problems that may be related to the medicine you are taking.  7.  Call 911 and bring the patient back to the ED if:        A.  The seizure lasts longer than 5 minutes.       B.  The patient doesn't awaken shortly after the seizure  C.  The patient has new problems such as difficulty seeing, speaking or moving  D.  The patient was injured during the seizure  E.  The patient has a temperature over 102 F (39C)  F.  The patient vomited and now is having trouble breathing

## 2023-02-21 NOTE — Progress Notes (Signed)
 NEUROLOGY FOLLOW UP OFFICE NOTE  Joshua Sanchez [id number] 12/25/2000  HISTORY OF PRESENT ILLNESS: I had the pleasure of seeing Joshua Sanchez in follow-up in the neurology clinic on 02/21/2023.  The patient was last seen 6 months ago for seizures. He is alone in the office today. Records and images were personally reviewed where available.  Since his last visit, he denies any seizures since December 2023, triggered by poor sleep hygiene. He is on Levetiracetam  1500mg  BID without side effects. Every once in a while he has a metallic taste and feels slightly confused for a minute or so, these are infrequent, mostly during stressful times. He denies any focal numbness/tingling/weakness, myoclonic jerks. He has a cold which has caused headaches and dizziness. He feels social anxiety also contributes to dizziness. He fell off a chair a couple of days ago, no significant injuries. Sleeping routine is okay.    History on Initial Assessment 12/01/2018: This is an 23 year old right-handed man with a history of autism spectrum disorder, ADHD, anxiety, presenting for evaluation of seizures. 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 He would drop things from his hand. One time he got dizzy with the shaking, no associated speech changes or confusion. He was having them at school where teachers would call his mother and tell him he did not seem right and was unable to focus after he alerted them about the hand shaking. Shaking would last 6-7 minutes. His mother reports having an EEG in [facility] where they did not find anything and he was not started on medication. He was having these episodes once a month, no clear triggers. On 11/01/2018, he recalls waking up feeling fine and taking a shower, then waking up in the ambulance. He had bitten his lip and felt diffusely weak with a headache. His mother had heard him fall and came to the  bathroom to find him with body jerking, head banging on the floor. She thinks he had urinary incontinence. She tried to get him up and he smacked her, confused. She feels his left side was weaker. He was brought to [facility] where CBC, CMP, UDS were normal. I personally reviewed head CT without contrast which was unremarkable. He was discharged home on Levetiracetam  500mg  BID. No further convulsions since then, but he had one episode of hand shaking yesterday where he felt funny/uneasy. He feels tired on the Levetiracetam . He reports headaches for the past 4 years occurring every other day, mostly on the left side with pressure and throbbing. Pain can last all day sometimes, with sensitivity to sound. No nausea/vomiting. He usually lays down if headaches are mild, and would take an Ibuprofen  sometimes. He denies any dizziness, diplopia, dysarthria/dysphagia, neck pain, bowel/bladder dysfunction. His mother denies any staring/unresponsive episodes. He denies any gaps in time, olfactory/gustatory hallucinations. Over the past 2 years, he has had sleep difficulties, awake at night and asleep during the day. He may take melatonin or a sleep aid to help sleep at night. His mother administers medications. He does not drive.  He was adopted at age 23, diagnosed with autism spectrum disorder, ADHD, anxiety at age 68 or 49. As far as his mother knows, he had a normal birth and early development, no febrile convulsions, CNS infections, family history of seizures.   Diagnostic Data: MRI brain with and without contrast done 12/2018 which did not show any acute changes, hippocampi symmetric with no  abnormal signal or enhancement seen.  1-hour wake and sleep EEG in 02/2019 was normal   PAST MEDICAL HISTORY: Past Medical History:  Diagnosis Date   ADHD (attention deficit hyperactivity disorder)    Anxiety and depression 11/13/2020   Autism    OCD (obsessive compulsive disorder)    Seizures (HCC)    focal    Separation anxiety     MEDICATIONS: Current Outpatient Medications on File Prior to Visit  Medication Sig Dispense Refill   acetaminophen  (TYLENOL ) 500 MG tablet Take 1 tablet (500 mg total) by mouth every 6 (six) hours as needed. 30 tablet 0   buPROPion  (WELLBUTRIN  XL) 300 MG 24 hr tablet Take 1 tablet (300 mg total) by mouth daily. 30 tablet 5   busPIRone  (BUSPAR ) 10 MG tablet Take 1 tablet (10 mg total) by mouth 2 (two) times daily. 60 tablet 5   cloNIDine  (CATAPRES ) 0.2 MG tablet Take 1 tablet (0.2 mg total) by mouth at bedtime. 30 tablet 5   hydrOXYzine  (ATARAX ) 25 MG tablet Take 1 tablet (25 mg total) by mouth 2 (two) times daily as needed for anxiety. 60 tablet 5   levETIRAcetam  (KEPPRA ) 1000 MG tablet TAKE 1& 1/2 TABLETS BY MOUTH TWICE DAILY 180 tablet 0   lisinopril  (ZESTRIL ) 10 MG tablet TAKE 1 TABLET BY MOUTH EVERY DAY FOR BLOOD PRESSURE 90 tablet 2   loratadine  (CLARITIN ) 10 MG tablet TAKE 1 TABLET BY MOUTH EVERY DAY FOR ALLERGIES 90 tablet 0   No current facility-administered medications on file prior to visit.    ALLERGIES: No Known Allergies  FAMILY HISTORY: Family History  Adopted: Yes  Problem Relation Age of Onset   Cancer Neg Hx    Clotting disorder Neg Hx    Rheumatologic disease Neg Hx     SOCIAL HISTORY: Social History   Socioeconomic History   Marital status: Single    Spouse name: Not on file   Number of children: Not on file   Years of education: Not on file   Highest education level: Not on file  Occupational History   Not on file  Tobacco Use   Smoking status: Never   Smokeless tobacco: Never  Vaping Use   Vaping status: Never Used  Substance and Sexual Activity   Alcohol use: Not Currently    Alcohol/week: 1.0 standard drink of alcohol    Types: 1 Standard drinks or equivalent per week    Comment: infrequent socially   Drug use: Never   Sexual activity: Never  Other Topics Concern   Not on file  Social History Narrative   Right  handed      Highest level of edu- 12th grade      Lives with mom   One story home 5 steps into home   Caff. sodas   Social Drivers of Health   Financial Resource Strain: Low Risk  (12/14/2020)   Overall Financial Resource Strain (CARDIA)    Difficulty of Paying Living Expenses: Not hard at all  Food Insecurity: No Food Insecurity (10/25/2021)   Hunger Vital Sign    Worried About Running Out of Food in the Last Year: Never true    Ran Out of Food in the Last Year: Never true  Transportation Needs: No Transportation Needs (10/25/2021)   PRAPARE - Administrator, Civil Service (Medical): No    Lack of Transportation (Non-Medical): No  Physical Activity: Insufficiently Active (10/25/2021)   Exercise Vital Sign    Days of Exercise per  Week: 1 day    Minutes of Exercise per Session: 20 min  Stress: No Stress Concern Present (12/14/2020)   [employer] of Occupational Health - Occupational Stress Questionnaire    Feeling of Stress : Not at all  Social Connections: Socially Isolated (12/14/2020)   Social Connection and Isolation Panel [NHANES]    Frequency of Communication with Friends and Family: More than three times a week    Frequency of Social Gatherings with Friends and Family: More than three times a week    Attends Religious Services: Never    Database Administrator or Organizations: No    Attends Banker Meetings: Never    Marital Status: Never married  Intimate Partner Violence: Not At Risk (12/14/2020)   Humiliation, Afraid, Rape, and Kick questionnaire    Fear of Current or Ex-Partner: No    Emotionally Abused: No    Physically Abused: No    Sexually Abused: No     PHYSICAL EXAM: Vitals:   02/21/23 1416  BP: 130/83  Pulse: (!) 102  SpO2: 95%   General: No acute distress Head:  Normocephalic/atraumatic Skin/Extremities: No rash, no edema Neurological Exam: alert and awake. No aphasia or dysarthria. Fund of knowledge is appropriate.    Attention and concentration are normal.   Cranial nerves: Pupils equal, round. Extraocular movements intact with no nystagmus. Visual fields full.  No facial asymmetry.  Motor: Bulk and tone normal, muscle strength 5/5 throughout with no pronator drift.   Finger to nose testing intact.  Gait narrow-based and steady, able to tandem walk adequately.  Romberg negative.   IMPRESSION: This is a 23 yo RH man with a history of autism spectrum disorder, ADHD, anxiety, with generalized convulsions, seizure-free for over 2 years until he had 2 seizures in April 2023 in the setting of missing medication and sleep deprivation, then another in December 2023 possibly due to poor sleep hygiene. No seizures since 01/2022, continue Levetiracetam  1500mg  BID.  He is aware of [place] driving laws to stop driving after a seizure until 6 months seizure-free. Follow-up in 6 months, call for any changes.    Thank you for allowing me to participate in his care.  Please do not hesitate to call for any questions or concerns.    Darice Shivers, M.D.   CC: Dr. Melvenia

## 2023-03-10 ENCOUNTER — Ambulatory Visit (INDEPENDENT_AMBULATORY_CARE_PROVIDER_SITE_OTHER): Payer: 59

## 2023-03-10 VITALS — Ht 70.0 in | Wt 260.0 lb

## 2023-03-10 DIAGNOSIS — Z0000 Encounter for general adult medical examination without abnormal findings: Secondary | ICD-10-CM | POA: Diagnosis not present

## 2023-03-10 NOTE — Patient Instructions (Signed)
Joshua Sanchez , Thank you for taking time to come for your Medicare Wellness Visit. I appreciate your ongoing commitment to your health goals. Please review the following plan we discussed and let me know if I can assist you in the future.   Referrals/Orders/Follow-Ups/Clinician Recommendations:  Next Medicare Annual Wellness Visit: March 15, 2024 at 9:20 am  virtual visit  This is a list of the screening recommended for you and due dates:  Health Maintenance  Topic Date Due   HPV Vaccine (1 - Male 3-dose series) Never done   Flu Shot  09/19/2022   COVID-19 Vaccine (3 - 2024-25 season) 10/20/2022   Medicare Annual Wellness Visit  03/09/2024   DTaP/Tdap/Td vaccine (2 - Td or Tdap) 10/08/2032   Hepatitis C Screening  Completed   HIV Screening  Completed    Advanced directives: (Declined) Advance directive discussed with you today. Even though you declined this today, please call our office should you change your mind, and we can give you the proper paperwork for you to fill out.  Next Medicare Annual Wellness Visit scheduled for next year: yes  Preventive Care 23-38 Years Old, Male Preventive care refers to lifestyle choices and visits with your health care provider that can promote health and wellness. Preventive care visits are also called wellness exams. What can I expect for my preventive care visit? Counseling During your preventive care visit, your health care provider may ask about your: Medical history, including: Past medical problems. Family medical history. Current health, including: Emotional well-being. Home life and relationship well-being. Sexual activity. Lifestyle, including: Alcohol, nicotine or tobacco, and drug use. Access to firearms. Diet, exercise, and sleep habits. Safety issues such as seatbelt and bike helmet use. Sunscreen use. Work and work Astronomer. Physical exam Your health care provider may check your: Height and weight. These may be used to  calculate your BMI (body mass index). BMI is a measurement that tells if you are at a healthy weight. Waist circumference. This measures the distance around your waistline. This measurement also tells if you are at a healthy weight and may help predict your risk of certain diseases, such as type 2 diabetes and high blood pressure. Heart rate and blood pressure. Body temperature. Skin for abnormal spots. What immunizations do I need?  Vaccines are usually given at various ages, according to a schedule. Your health care provider will recommend vaccines for you based on your age, medical history, and lifestyle or other factors, such as travel or where you work. What tests do I need? Screening Your health care provider may recommend screening tests for certain conditions. This may include: Lipid and cholesterol levels. Diabetes screening. This is done by checking your blood sugar (glucose) after you have not eaten for a while (fasting). Hepatitis B test. Hepatitis C test. HIV (human immunodeficiency virus) test. STI (sexually transmitted infection) testing, if you are at risk. Talk with your health care provider about your test results, treatment options, and if necessary, the need for more tests. Follow these instructions at home: Eating and drinking  Eat a healthy diet that includes fresh fruits and vegetables, whole grains, lean protein, and low-fat dairy products. Drink enough fluid to keep your urine pale yellow. Take vitamin and mineral supplements as recommended by your health care provider. Do not drink alcohol if your health care provider tells you not to drink. If you drink alcohol: Limit how much you have to 0-2 drinks a day. Know how much alcohol is in your drink.  In the [place] one drink equals one 12 oz bottle of beer (355 mL), one 5 oz glass of wine (148 mL), or one 1 oz glass of hard liquor (44 mL). Lifestyle Brush your teeth every morning and night with fluoride toothpaste.  Floss one time each day. Exercise for at least 30 minutes 5 or more days each week. Do not use any products that contain nicotine or tobacco. These products include cigarettes, chewing tobacco, and vaping devices, such as e-cigarettes. If you need help quitting, ask your health care provider. Do not use drugs. If you are sexually active, practice safe sex. Use a condom or other form of protection to prevent STIs. Find healthy ways to manage stress, such as: Meditation, yoga, or listening to music. Journaling. Talking to a trusted person. Spending time with friends and family. Minimize exposure to UV radiation to reduce your risk of skin cancer. Safety Always wear your seat belt while driving or riding in a vehicle. Do not drive: If you have been drinking alcohol. Do not ride with someone who has been drinking. If you have been using any mind-altering substances or drugs. While texting. When you are tired or distracted. Wear a helmet and other protective equipment during sports activities. If you have firearms in your house, make sure you follow all gun safety procedures. Seek help if you have been physically or sexually abused. What's next? Go to your health care provider once a year for an annual wellness visit. Ask your health care provider how often you should have your eyes and teeth checked. Stay up to date on all vaccines. This information is not intended to replace advice given to you by your health care provider. Make sure you discuss any questions you have with your health care provider. Document Revised: 08/02/2020 Document Reviewed: 08/02/2020 Elsevier Patient Education  2024 [employer].  Understanding Your Risk for Falls Millions of people have serious injuries from falls each year. It is important to understand your risk of falling. Talk with your health care provider about your risk and what you can do to lower it. If you do have a serious fall, make sure to tell your  provider. Falling once raises your risk of falling again. How can falls affect me? Serious injuries from falls are common. These include: Broken bones, such as hip fractures. Head injuries, such as traumatic brain injuries (TBI) or concussions. A fear of falling can cause you to avoid activities and stay at home. This can make your muscles weaker and raise your risk for a fall. What can increase my risk? There are a number of risk factors that increase your risk for falling. The more risk factors you have, the higher your risk of falling. Serious injuries from a fall happen most often to people who are older than 24 years old. Teenagers and young adults ages 20-29 are also at higher risk. Common risk factors include: Weakness in the lower body. Being generally weak or confused due to long-term (chronic) illness. Dizziness or balance problems. Poor vision. Medicines that cause dizziness or drowsiness. These may include: Medicines for your blood pressure, heart, anxiety, insomnia, or swelling (edema). Pain medicines. Muscle relaxants. Other risk factors include: Drinking alcohol. Having had a fall in the past. Having foot pain or wearing improper footwear. Working at a dangerous job. Having any of the following in your home: Tripping hazards, such as floor clutter or loose rugs. Poor lighting. Pets. Having dementia or memory loss. What actions can I take  to lower my risk of falling?     Physical activity Stay physically fit. Do strength and balance exercises. Consider taking a regular class to build strength and balance. Yoga and tai chi are good options. Vision Have your eyes checked every year and your prescription for glasses or contacts updated as needed. Shoes and walking aids Wear non-skid shoes. Wear shoes that have rubber soles and low heels. Do not wear high heels. Do not walk around the house in socks or slippers. Use a cane or walker as told by your provider. Home  safety Attach secure railings on both sides of your stairs. Install grab bars for your bathtub, shower, and toilet. Use a non-skid mat in your bathtub or shower. Attach bath mats securely with double-sided, non-slip rug tape. Use good lighting in all rooms. Keep a flashlight near your bed. Make sure there is a clear path from your bed to the bathroom. Use night-lights. Do not use throw rugs. Make sure all carpeting is taped or tacked down securely. Remove all clutter from walkways and stairways, including extension cords. Repair uneven or broken steps and floors. Avoid walking on icy or slippery surfaces. Walk on the grass instead of on icy or slick sidewalks. Use ice melter to get rid of ice on walkways in the winter. Use a cordless phone. Questions to ask your health care provider Can you help me check my risk for a fall? Do any of my medicines make me more likely to fall? Should I take a vitamin D supplement? What exercises can I do to improve my strength and balance? Should I make an appointment to have my vision checked? Do I need a bone density test to check for weak bones (osteoporosis)? Would it help to use a cane or a walker? Where to find more information Centers for Disease Control and Prevention, [employer]: TonerPromos.no Community-Based Fall Prevention Programs: TonerPromos.no [employer] on Aging: BaseRingTones.pl Contact a health care provider if: You fall at home. You are afraid of falling at home. You feel weak, drowsy, or dizzy. This information is not intended to replace advice given to you by your health care provider. Make sure you discuss any questions you have with your health care provider. Document Revised: 10/08/2021 Document Reviewed: 10/08/2021 Elsevier Patient Education  2024 [employer].

## 2023-03-10 NOTE — Progress Notes (Signed)
Because this visit was a virtual/telehealth visit,  certain criteria was not obtained, such a blood pressure, CBG if applicable, and timed get up and go. Any medications not marked as "taking" were not mentioned during the medication reconciliation part of the visit. Any vitals not documented were not able to be obtained due to this being a telehealth visit or patient was unable to self-report a recent blood pressure reading due to a lack of equipment at home via telehealth. Vitals that have been documented are verbally provided by the patient.  Interactive audio and video telecommunications were attempted between this provider and patient, however failed, due to patient having technical difficulties OR patient did not have access to video capability.  We continued and completed visit with audio only.  Subjective:   Joshua Sanchez is a 23 y.o. male who presents for Medicare Annual/Subsequent preventive examination.  Visit Complete: Virtual I connected with  Joshua Sanchez on 03/10/23 by a audio enabled telemedicine application and verified that I am speaking with the correct person using two identifiers.  Patient Location: Home  Provider Location: Office/Clinic  I discussed the limitations of evaluation and management by telemedicine. The patient expressed understanding and agreed to proceed.  Vital Signs: Because this visit was a virtual/telehealth visit, some criteria may be missing or patient reported. Any vitals not documented were not able to be obtained and vitals that have been documented are patient reported.  Patient Medicare AWV questionnaire was completed by the patient on na; I have confirmed that all information answered by patient is correct and no changes since this date.  Cardiac Risk Factors include: advanced age (>44men, >31 women);hypertension;male gender;obesity (BMI >30kg/m2);sedentary lifestyle     Objective:    Today's Vitals   03/10/23 1239 03/10/23 1240  Weight: 260  lb (117.9 kg)   Height: 5\' 10"  (1.778 m)   PainSc:  3    Body mass index is 37.31 kg/m.     03/10/2023   12:42 PM 02/21/2023    2:26 PM 07/30/2022    1:30 PM 01/28/2022    1:10 PM 06/26/2021    3:34 PM 12/14/2020   11:27 AM 06/26/2020    3:49 PM  Advanced Directives  Does Patient Have a Medical Advance Directive? No No No No No No No  Would patient like information on creating a medical advance directive? No - Patient declined     Yes (ED - Information included in AVS)     Current Medications (verified) Outpatient Encounter Medications as of 03/10/2023  Medication Sig   acetaminophen (TYLENOL) 500 MG tablet Take 1 tablet (500 mg total) by mouth every 6 (six) hours as needed.   buPROPion (WELLBUTRIN XL) 300 MG 24 hr tablet Take 1 tablet (300 mg total) by mouth daily.   busPIRone (BUSPAR) 10 MG tablet Take 1 tablet (10 mg total) by mouth 2 (two) times daily.   cloNIDine (CATAPRES) 0.2 MG tablet Take 1 tablet (0.2 mg total) by mouth at bedtime.   hydrOXYzine (ATARAX) 25 MG tablet Take 1 tablet (25 mg total) by mouth 2 (two) times daily as needed for anxiety.   levETIRAcetam (KEPPRA) 1000 MG tablet TAKE 1& 1/2 TABLETS BY MOUTH TWICE DAILY   lisinopril (ZESTRIL) 10 MG tablet TAKE 1 TABLET BY MOUTH EVERY DAY FOR BLOOD PRESSURE   loratadine (CLARITIN) 10 MG tablet TAKE 1 TABLET BY MOUTH EVERY DAY FOR ALLERGIES   No facility-administered encounter medications on file as of 03/10/2023.    Allergies (verified) Patient has  no known allergies.   History: Past Medical History:  Diagnosis Date   ADHD (attention deficit hyperactivity disorder)    Anxiety and depression 11/13/2020   Autism    OCD (obsessive compulsive disorder)    Seizures (HCC)    focal   Separation anxiety    Past Surgical History:  Procedure Laterality Date   TONSILLECTOMY     Family History  Adopted: Yes  Problem Relation Age of Onset   Cancer Neg Hx    Clotting disorder Neg Hx    Rheumatologic disease Neg Hx     Social History   Socioeconomic History   Marital status: Single    Spouse name: Not on file   Number of children: Not on file   Years of education: Not on file   Highest education level: Not on file  Occupational History   Not on file  Tobacco Use   Smoking status: Never   Smokeless tobacco: Never  Vaping Use   Vaping status: Never Used  Substance and Sexual Activity   Alcohol use: Not Currently    Alcohol/week: 1.0 standard drink of alcohol    Types: 1 Standard drinks or equivalent per week    Comment: infrequent socially   Drug use: Never   Sexual activity: Never  Other Topics Concern   Not on file  Social History Narrative   Right handed      Highest level of edu- 12th grade      Lives with mom   One story home 5 steps into home   Caff. sodas   Social Drivers of Health   Financial Resource Strain: Medium Risk (03/10/2023)   Overall Financial Resource Strain (CARDIA)    Difficulty of Paying Living Expenses: Somewhat hard  Food Insecurity: No Food Insecurity (03/10/2023)   Hunger Vital Sign    Worried About Running Out of Food in the Last Year: Never true    Ran Out of Food in the Last Year: Never true  Transportation Needs: No Transportation Needs (03/10/2023)   PRAPARE - Administrator, Civil Service (Medical): No    Lack of Transportation (Non-Medical): No  Physical Activity: Insufficiently Active (03/10/2023)   Exercise Vital Sign    Days of Exercise per Week: 1 day    Minutes of Exercise per Session: 20 min  Stress: No Stress Concern Present (03/10/2023)   [employer] of Occupational Health - Occupational Stress Questionnaire    Feeling of Stress : Not at all  Social Connections: Moderately Isolated (03/10/2023)   Social Connection and Isolation Panel [NHANES]    Frequency of Communication with Friends and Family: More than three times a week    Frequency of Social Gatherings with Friends and Family: More than three times a week    Attends  Religious Services: More than 4 times per year    Active Member of [employer] or Organizations: No    Attends Banker Meetings: Never    Marital Status: Never married    Tobacco Counseling Counseling given: Yes   Clinical Intake:  Pre-visit preparation completed: Yes  Pain : 0-10 Pain Score: 3  Pain Type: Acute pain Pain Location: Back Pain Orientation: Lower (pt states it is probably from the way he slept) Pain Descriptors / Indicators: Throbbing, Constant Pain Onset: Today     BMI - recorded: 37.31 Nutritional Status: BMI > 30  Obese Nutritional Risks: None Diabetes: No  How often do you need to have someone help you when you  read instructions, pamphlets, or other written materials from your doctor or pharmacy?: 1 - Never  Interpreter Needed?: No  Information entered by :: Abby Binh Doten, CMA   Activities of Daily Living    03/10/2023   12:41 PM  In your present state of health, do you have any difficulty performing the following activities:  Hearing? 0  Vision? 0  Comment [facility]  Difficulty concentrating or making decisions? 0  Walking or climbing stairs? 0  Dressing or bathing? 0  Doing errands, shopping? 0  Preparing Food and eating ? N  Using the Toilet? N  In the past six months, have you accidently leaked urine? N  Do you have problems with loss of bowel control? N  Managing your Medications? N  Managing your Finances? N  Housekeeping or managing your Housekeeping? N    Patient Care Team: Billie Lade, MD as PCP - General (Internal Medicine) Van Clines, MD as Consulting Physician (Neurology) Elsie Lincoln, MD as Consulting Physician (Psychiatry)  Indicate any recent Medical Services you may have received from other than [facility] providers in the past year (date may be approximate).     Assessment:   This is a routine wellness examination for [place].  Hearing/Vision screen Hearing Screening - Comments:: Patient  denies any hearing difficulties.   Vision Screening - Comments:: Wears rx glasses - up to date with routine eye exams  Sees Dr. Charise Killian at [facility] office   Goals Addressed             This Visit's Progress    Patient Stated       Continue to improve my mental health       Depression Screen    03/10/2023   12:46 PM 10/09/2022    2:56 PM 04/10/2022    3:43 PM 01/30/2022   11:20 AM 12/17/2021    2:45 PM 11/15/2021   12:51 PM 09/19/2021    8:08 AM  PHQ 2/9 Scores  PHQ - 2 Score 2 4 5  2  3   PHQ- 9 Score 10 15 20    8      Information is confidential and restricted. Go to Review Flowsheets to unlock data.    Fall Risk    03/10/2023   12:42 PM 02/21/2023    2:26 PM 10/09/2022    2:56 PM 07/30/2022    1:30 PM 04/10/2022    3:43 PM  Fall Risk   Falls in the past year? 1 1 0 0 0  Number falls in past yr: 0 0  0 0  Injury with Fall? 0 0  0 0  Risk for fall due to : History of fall(s);Other (Comment)    No Fall Risks  Risk for fall due to: Comment pt stated he fell out of his chair because of the way he was sitting but no injury      Follow up Falls prevention discussed Falls evaluation completed  Falls evaluation completed Falls evaluation completed    MEDICARE RISK AT HOME: Medicare Risk at Home Any stairs in or around the home?: Yes If so, are there any without handrails?: No Home free of loose throw rugs in walkways, pet beds, electrical cords, etc?: Yes Adequate lighting in your home to reduce risk of falls?: Yes Life alert?: No Use of a cane, walker or w/c?: No Grab bars in the bathroom?: No Shower chair or bench in shower?: Yes Elevated toilet seat or a handicapped toilet?:  No  TIMED UP AND GO:  Was the test performed?  No    Cognitive Function:    12/14/2020   11:28 AM  MMSE - Mini Mental State Exam  Not completed: Unable to complete        03/10/2023   12:44 PM 12/14/2020   11:28 AM  6CIT Screen  What Year? 0 points 0 points  What  month? 0 points 0 points  What time? 0 points 0 points  Count back from 20 0 points 0 points  Months in reverse 0 points 0 points  Repeat phrase 0 points 0 points  Total Score 0 points 0 points    Immunizations Immunization History  Administered Date(s) Administered   Influenza,inj,Quad PF,6+ Mos 12/07/2017, 12/14/2018, 04/10/2022   Influenza-Unspecified 11/21/2020   Moderna Sars-Covid-2 Vaccination 07/05/2019, 08/05/2019   Tdap 10/09/2022    TDAP status: Up to date  Flu Vaccine status: Due, Education has been provided regarding the importance of this vaccine. Advised may receive this vaccine at local pharmacy or Health Dept. Aware to provide a copy of the vaccination record if obtained from local pharmacy or Health Dept. Verbalized acceptance and understanding.  Pneumococcal vaccine status: Not age appropriate for this patient.    Covid-19 vaccine status: Information provided on how to obtain vaccines.   Shingrix: Not age appropriate for this patient.    Screening Tests Health Maintenance  Topic Date Due   HPV VACCINES (1 - Male 3-dose series) Never done   INFLUENZA VACCINE  09/19/2022   COVID-19 Vaccine (3 - 2024-25 season) 10/20/2022   Medicare Annual Wellness (AWV)  12/18/2022   DTaP/Tdap/Td (2 - Td or Tdap) 10/08/2032   Hepatitis C Screening  Completed   HIV Screening  Completed    Health Maintenance  Health Maintenance Due  Topic Date Due   HPV VACCINES (1 - Male 3-dose series) Never done   INFLUENZA VACCINE  09/19/2022   COVID-19 Vaccine (3 - 2024-25 season) 10/20/2022   Medicare Annual Wellness (AWV)  12/18/2022    Colorectal Cancer Screenings: Not age appropriate for this patient.    Lung Cancer Screening: (Low Dose CT Chest recommended if Age 72-80 years, 20 pack-year currently smoking OR have quit w/in 15years.) does not qualify.   Lung Cancer Screening Referral: na  Additional Screening:  Hepatitis C Screening:  qualify; Completed   Vision  Screening: Recommended annual ophthalmology exams for early detection of glaucoma and other disorders of the eye. Is the patient up to date with their annual eye exam?  Yes  Who is the provider or what is the name of the office in which the patient attends annual eye exams? Dr. Charise Killian w/ [facility] If pt is not established with a provider, would they like to be referred to a provider to establish care? No .   Dental Screening: Recommended annual dental exams for proper oral hygiene  Diabetic Foot Exam: na  Community Resource Referral / Chronic Care Management: CRR required this visit?  No   CCM required this visit?  No     Plan:     I have personally reviewed and noted the following in the patient's chart:   Medical and social history Use of alcohol, tobacco or illicit drugs  Current medications and supplements including opioid prescriptions. Patient is not currently taking opioid prescriptions. Functional ability and status Nutritional status Physical activity Advanced directives List of other physicians Hospitalizations, surgeries, and ER visits in previous 12 months Vitals Screenings to  include cognitive, depression, and falls Referrals and appointments  In addition, I have reviewed and discussed with patient certain preventive protocols, quality metrics, and best practice recommendations. A written personalized care plan for preventive services as well as general preventive health recommendations were provided to patient.     Jordan Hawks Louann Hopson, CMA   03/10/2023   After Visit Summary: (Mail) Due to this being a telephonic visit, the after visit summary with patients personalized plan was offered to patient via mail   Nurse Notes: none

## 2023-04-03 ENCOUNTER — Other Ambulatory Visit: Payer: Self-pay | Admitting: Internal Medicine

## 2023-04-11 ENCOUNTER — Ambulatory Visit (INDEPENDENT_AMBULATORY_CARE_PROVIDER_SITE_OTHER): Payer: 59 | Admitting: Internal Medicine

## 2023-04-11 ENCOUNTER — Encounter: Payer: Self-pay | Admitting: Internal Medicine

## 2023-04-11 VITALS — BP 119/83 | HR 100 | Ht 69.0 in | Wt 257.0 lb

## 2023-04-11 DIAGNOSIS — E559 Vitamin D deficiency, unspecified: Secondary | ICD-10-CM | POA: Insufficient documentation

## 2023-04-11 DIAGNOSIS — I10 Essential (primary) hypertension: Secondary | ICD-10-CM | POA: Diagnosis not present

## 2023-04-11 DIAGNOSIS — R569 Unspecified convulsions: Secondary | ICD-10-CM

## 2023-04-11 DIAGNOSIS — F330 Major depressive disorder, recurrent, mild: Secondary | ICD-10-CM | POA: Diagnosis not present

## 2023-04-11 DIAGNOSIS — F908 Attention-deficit hyperactivity disorder, other type: Secondary | ICD-10-CM

## 2023-04-11 NOTE — Patient Instructions (Signed)
 It was a pleasure to see you today.  Thank you for giving [place] the opportunity to be involved in your care.  Below is a brief recap of your visit and next steps.  We will plan to see you again in 6 months.  Summary No medication changes today I recommend starting daily vitamin D supplementation Follow up in 6 months for annual physical

## 2023-04-11 NOTE — Assessment & Plan Note (Signed)
 Denies recent seizure activity.  Seen by neurology for follow-up in January.  Remains on Keppra 1500 mg twice daily.

## 2023-04-11 NOTE — Assessment & Plan Note (Signed)
 Remains adequately controlled with lisinopril 10 mg daily.

## 2023-04-11 NOTE — Progress Notes (Signed)
 Established Patient Office Visit  Subjective   Patient ID: Joshua Sanchez, male    DOB: [birth year]  Age: 23 y.o. MRN: [id number]  Chief Complaint  Patient presents with   Care Management    Six month follow up    Joshua Sanchez returns here today for routine follow-up.  He was last evaluated by me in August 2024 for his annual physical.  No medication changes were made at that time and 60-month follow-up was arranged.  In the interim, he has been evaluated by neurology and psychiatry.  There have otherwise been no acute interval events.  Joshua Sanchez reports feeling well today.  He is asymptomatic and has no acute concerns to discuss.  Past Medical History:  Diagnosis Date   ADHD (attention deficit hyperactivity disorder)    Anxiety and depression 11/13/2020   Autism    OCD (obsessive compulsive disorder)    Seizures (HCC)    focal   Separation anxiety    Past Surgical History:  Procedure Laterality Date   TONSILLECTOMY     Social History   Tobacco Use   Smoking status: Never   Smokeless tobacco: Never  Vaping Use   Vaping status: Never Used  Substance Use Topics   Alcohol use: Not Currently    Alcohol/week: 1.0 standard drink of alcohol    Types: 1 Standard drinks or equivalent per week    Comment: infrequent socially   Drug use: Never   Family History  Adopted: Yes  Problem Relation Age of Onset   Cancer Neg Hx    Clotting disorder Neg Hx    Rheumatologic disease Neg Hx    No Known Allergies  Review of Systems  Constitutional:  Negative for chills and fever.  HENT:  Negative for sore throat.   Respiratory:  Negative for cough and shortness of breath.   Cardiovascular:  Negative for chest pain, palpitations and leg swelling.  Gastrointestinal:  Negative for abdominal pain, blood in stool, constipation, diarrhea, nausea and vomiting.  Genitourinary:  Negative for dysuria and hematuria.  Musculoskeletal:  Negative for myalgias.  Skin:  Negative for itching and  rash.  Neurological:  Negative for dizziness and headaches.  Psychiatric/Behavioral:  Negative for depression and suicidal ideas.      Objective:     BP 119/83 (BP Location: Left Arm, Patient Position: Sitting, Cuff Size: Normal)   Pulse 100   Ht 5\' 9"  (1.753 m)   Wt 257 lb (116.6 kg)   SpO2 95%   BMI 37.95 kg/m  BP Readings from Last 3 Encounters:  04/11/23 119/83  02/21/23 130/83  10/09/22 131/84   Physical Exam Vitals reviewed.  Constitutional:      General: He is not in acute distress.    Appearance: Normal appearance. He is obese. He is not ill-appearing.  HENT:     Head: Normocephalic and atraumatic.     Right Ear: External ear normal.     Left Ear: External ear normal.     Nose: Nose normal. No congestion or rhinorrhea.     Mouth/Throat:     Mouth: Mucous membranes are moist.     Pharynx: Oropharynx is clear.  Eyes:     General: No scleral icterus.    Extraocular Movements: Extraocular movements intact.     Conjunctiva/sclera: Conjunctivae normal.     Pupils: Pupils are equal, round, and reactive to light.  Cardiovascular:     Rate and Rhythm: Normal rate and regular rhythm.     Pulses: Normal  pulses.     Heart sounds: Normal heart sounds. No murmur heard. Pulmonary:     Effort: Pulmonary effort is normal.     Breath sounds: Normal breath sounds. No wheezing, rhonchi or rales.  Abdominal:     General: Abdomen is flat. Bowel sounds are normal. There is no distension.     Palpations: Abdomen is soft.     Tenderness: There is no abdominal tenderness.  Musculoskeletal:        General: No swelling or deformity. Normal range of motion.     Cervical back: Normal range of motion.  Skin:    General: Skin is warm and dry.     Capillary Refill: Capillary refill takes less than 2 seconds.  Neurological:     General: No focal deficit present.     Mental Status: He is alert and oriented to person, place, and time.     Motor: No weakness.  Psychiatric:        Mood  and Affect: Mood normal.        Behavior: Behavior normal.        Thought Content: Thought content normal.   Last CBC Lab Results  Component Value Date   WBC 10.1 10/09/2022   HGB 15.5 10/09/2022   HCT 47.8 10/09/2022   MCV 80 10/09/2022   MCH 25.9 (L) 10/09/2022   RDW 13.9 10/09/2022   PLT 223 10/09/2022   Last metabolic panel Lab Results  Component Value Date   GLUCOSE 82 10/09/2022   NA 140 10/09/2022   K 4.2 10/09/2022   CL 101 10/09/2022   CO2 21 10/09/2022   BUN 12 10/09/2022   CREATININE 1.17 10/09/2022   EGFR 90 10/09/2022   CALCIUM 9.6 10/09/2022   PROT 7.2 10/09/2022   ALBUMIN 4.3 10/09/2022   LABGLOB 2.9 10/09/2022   AGRATIO 1.3 06/06/2021   BILITOT 0.4 10/09/2022   ALKPHOS 83 10/09/2022   AST 19 10/09/2022   ALT 28 10/09/2022   ANIONGAP 9 11/01/2018   Last lipids Lab Results  Component Value Date   CHOL 163 10/09/2022   HDL 41 10/09/2022   LDLCALC 106 (H) 10/09/2022   TRIG 82 10/09/2022   CHOLHDL 4.0 10/09/2022   Last hemoglobin A1c Lab Results  Component Value Date   HGBA1C 5.4 10/09/2022   Last thyroid functions Lab Results  Component Value Date   TSH 1.900 10/09/2022   Last vitamin D Lab Results  Component Value Date   VD25OH 11.2 (L) 10/09/2022   Last vitamin B12 and Folate Lab Results  Component Value Date   VITAMINB12 472 10/09/2022   FOLATE 6.2 10/09/2022     Assessment & Plan:   Problem List Items Addressed This Visit       Hypertension - Primary   Remains adequately controlled with lisinopril 10 mg daily.      Seizures (HCC)   Denies recent seizure activity.  Seen by neurology for follow-up in January.  Remains on Keppra 1500 mg twice daily.      ADHD   Stable with clonidine 0.2 mg nightly.  Psychiatry follow-up scheduled for May.      Mild episode of recurrent major depressive disorder (HCC)   Mood remains stable with Wellbutrin, BuSpar, and hydroxyzine.  Psychiatry follow-up is scheduled for May.       Vitamin D deficiency   Noted on labs from August 2024.  Recommend starting daily vitamin D supplementation, 2000 IU daily.      Return in about 6  months (around 10/09/2023) for CPE.   Billie Lade, MD

## 2023-04-11 NOTE — Assessment & Plan Note (Signed)
 Stable with clonidine 0.2 mg nightly.  Psychiatry follow-up scheduled for May.

## 2023-04-11 NOTE — Assessment & Plan Note (Signed)
 Mood remains stable with Wellbutrin, BuSpar, and hydroxyzine.  Psychiatry follow-up is scheduled for May.

## 2023-04-11 NOTE — Progress Notes (Signed)
 Joshua Sanchez

## 2023-04-11 NOTE — Assessment & Plan Note (Signed)
 Noted on labs from August 2024.  Recommend starting daily vitamin D supplementation, 2000 IU daily.

## 2023-04-28 ENCOUNTER — Telehealth (INDEPENDENT_AMBULATORY_CARE_PROVIDER_SITE_OTHER): Admitting: Psychiatry

## 2023-04-28 ENCOUNTER — Encounter (HOSPITAL_COMMUNITY): Payer: Self-pay | Admitting: Psychiatry

## 2023-04-28 DIAGNOSIS — F908 Attention-deficit hyperactivity disorder, other type: Secondary | ICD-10-CM

## 2023-04-28 DIAGNOSIS — F411 Generalized anxiety disorder: Secondary | ICD-10-CM | POA: Diagnosis not present

## 2023-04-28 DIAGNOSIS — F840 Autistic disorder: Secondary | ICD-10-CM

## 2023-04-28 DIAGNOSIS — F330 Major depressive disorder, recurrent, mild: Secondary | ICD-10-CM | POA: Diagnosis not present

## 2023-04-28 MED ORDER — BUPROPION HCL ER (XL) 300 MG PO TB24: 300.0000 mg | ORAL_TABLET | Freq: Every day | ORAL | 5 refills | Status: DC

## 2023-04-28 MED ORDER — BUSPIRONE HCL 10 MG PO TABS: 10.0000 mg | ORAL_TABLET | Freq: Two times a day (BID) | ORAL | 5 refills | Status: DC

## 2023-04-28 MED ORDER — CLONIDINE HCL 0.2 MG PO TABS: 0.2000 mg | ORAL_TABLET | Freq: Every day | ORAL | 5 refills | Status: AC

## 2023-04-28 MED ORDER — HYDROXYZINE HCL 25 MG PO TABS: 25.0000 mg | ORAL_TABLET | Freq: Two times a day (BID) | ORAL | 5 refills | Status: AC | PRN

## 2023-04-28 NOTE — Progress Notes (Signed)
 BH MD Outpatient Progress Note  04/28/2023 4:00 PM Joshua Sanchez  MRN:  [id number]  Assessment:  Joshua Sanchez presents for follow-up evaluation. Today, 04/28/23, patient reports overall stability of mood in the setting of maintenance of extremely sedentary lifestyle primarily watching YouTube and playing videogames.  His caretaker, Ms. [name], agrees that no medication changes are warranted at this time given this information and we will continue to focus on behavioral modification of becoming more active.  To that end have continued to encourage vocational rehab services as his resistance to changes in his routine are likely stemming from his autism.  Sleep cycle is worsened again with resumption of lack of activity during the day. With his autism do not suspect there will be significant change in the rigidity of his thought over time.  We will continue to strongly encouraged him to reach out to the autism Society for vocational rehab and to begin to do much more regular daily exercise and getting outdoors.  He continues in psychotherapy.  No follow-up planned due to provider leaving office.  Identifying Information: Joshua Sanchez is a 23 y.o. 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 Initial presentation on 11/15/21, see that note for full case formulation. Mood symptoms noted to be well controlled on combination of wellbutrin, buspar, and hydroxyzine as needed at that time. He has had formal testing for autism and ADHD after he was adopted at the age of 39 and in interaction with [name], concurred with these diagnosis. His adoptive mother, [name], was present for much of the interview and able to fill in gaps around his history. His main psychiatric symptom was lower  frustration tolerance around social interactions and some rigidity of behaviors. Did discuss wellbutrin use with neurology in between visits and they are ok with continuing wellbutrin at current dosage with regard to seizure risk.    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 Karel Jarvis  Patient was given contact information for behavioral health clinic and was instructed to call 911 for emergencies.   Subjective:  Chief Complaint:  Chief Complaint  Patient presents with   Autism   Anxiety   Depression   Follow-up    Interval History: Link sent to [phone].   Things have been better since last appointment, he had an existential crisis feels like he has calmed down a good bit. Feels like he is self aware of what is causing his depression but then loses motivation and stops trying to work on things. Feels better that he doesn't plan on changing at this point including stopping looking for a job. Encouraged him to utilize vocational rehab services as previously discussed. Will still stay up too late doing activities  or sleep in too late. Doesn't like being alone with his thoughts so listens to music. The thoughts are that he isn't where he wants to be but as above does not want to take steps  to change this. Addressed cognitive distortion present in this. Still trying to learn how to code and is trying to learn [place]. Eats once or twice per day which can be 1-2 sandwiches per day unless he is eating [employer]. Taking hydroyxzine has been twice daily scheduled, encouraged PRN use. Still no seizures.  [name] has no concerns. Still is avoiding autism society because he doesn't want to be around people but [name] says he did reach out but classes were full.   Visit Diagnosis:    ICD-10-CM   1. Autism  F84.0 cloNIDine (CATAPRES) 0.2 MG tablet    2. Generalized anxiety disorder  F41.1 busPIRone (BUSPAR) 10 MG tablet    buPROPion (WELLBUTRIN XL) 300 MG 24 hr tablet    hydrOXYzine (ATARAX) 25 MG tablet    3. Mild episode of recurrent major depressive disorder (HCC)  F33.0 buPROPion (WELLBUTRIN XL) 300 MG 24 hr tablet    4. Other specified attention deficit hyperactivity disorder (ADHD)  F90.8 buPROPion (WELLBUTRIN XL) 300 MG 24 hr tablet    cloNIDine (CATAPRES) 0.2 MG tablet         Past Psychiatric History:  Diagnoses: autism spectrum disorder, ADHD, generalized anxiety, major depression, generalized idopathic epilepsy Medication trials: yes but current medication is effective for calm. Without it difficult. Ritalin made angry and hyperactive and didn't eat. Prozac like zombie, sertraline sounds familiar.  Previous psychiatrist/therapist: yes since kindergarten and worked closely with [facility]. Hospitalizations: none Suicide attempts: none SIB: none Hx of violence towards others: in defense or when standing too close Current access to guns: none Hx of abuse: none that he is aware of Substance use: none  Past Medical History:  Past Medical History:  Diagnosis Date   ADHD (attention deficit hyperactivity disorder)    Anxiety and depression 11/13/2020   Autism    OCD (obsessive compulsive disorder)    Seizures (HCC)    focal   Separation anxiety     Past  Surgical History:  Procedure Laterality Date   TONSILLECTOMY      Family Psychiatric History: unknown-adopted  Family History:  Family History  Adopted: Yes  Problem Relation Age of Onset   Cancer Neg Hx    Clotting disorder Neg Hx    Rheumatologic disease Neg Hx     Social History:  Social History   Socioeconomic History   Marital status: Single    Spouse name: Not on file   Number of children: Not on file   Years of education: Not on file   Highest education level: Not on file  Occupational History   Not on file  Tobacco Use   Smoking status: Never   Smokeless tobacco: Never  Vaping Use   Vaping status: Never Used  Substance and Sexual Activity   Alcohol use: Not Currently    Alcohol/week: 1.0 standard drink of alcohol    Types: 1 Standard drinks or equivalent per week    Comment: infrequent socially   Drug use: Never   Sexual activity: Never  Other Topics Concern   Not on file  Social History Narrative   Right handed      Highest level of edu- 12th grade      Lives with mom   One story home 5 steps into  home   Caff. sodas   Social Drivers of Health   Financial Resource Strain: Medium Risk (03/10/2023)   Overall Financial Resource Strain (CARDIA)    Difficulty of Paying Living Expenses: Somewhat hard  Food Insecurity: No Food Insecurity (03/10/2023)   Hunger Vital Sign    Worried About Running Out of Food in the Last Year: Never true    Ran Out of Food in the Last Year: Never true  Transportation Needs: No Transportation Needs (03/10/2023)   PRAPARE - Administrator, Civil Service (Medical): No    Lack of Transportation (Non-Medical): No  Physical Activity: Insufficiently Active (03/10/2023)   Exercise Vital Sign    Days of Exercise per Week: 1 day    Minutes of Exercise per Session: 20 min  Stress: No Stress Concern Present (03/10/2023)   [employer] of Occupational Health - Occupational Stress Questionnaire    Feeling of Stress :  Not at all  Social Connections: Moderately Isolated (03/10/2023)   Social Connection and Isolation Panel [NHANES]    Frequency of Communication with Friends and Family: More than three times a week    Frequency of Social Gatherings with Friends and Family: More than three times a week    Attends Religious Services: More than 4 times per year    Active Member of [employer] or Organizations: No    Attends Banker Meetings: Never    Marital Status: Never married    Allergies: No Known Allergies  Current Medications: Current Outpatient Medications  Medication Sig Dispense Refill   acetaminophen (TYLENOL) 500 MG tablet Take 1 tablet (500 mg total) by mouth every 6 (six) hours as needed. 30 tablet 0   buPROPion (WELLBUTRIN XL) 300 MG 24 hr tablet Take 1 tablet (300 mg total) by mouth daily. 30 tablet 5   busPIRone (BUSPAR) 10 MG tablet Take 1 tablet (10 mg total) by mouth 2 (two) times daily. 60 tablet 5   cloNIDine (CATAPRES) 0.2 MG tablet Take 1 tablet (0.2 mg total) by mouth at bedtime. 30 tablet 5   hydrOXYzine (ATARAX) 25 MG tablet Take 1 tablet (25 mg total) by mouth 2 (two) times daily as needed for anxiety. 60 tablet 5   levETIRAcetam (KEPPRA) 1000 MG tablet TAKE 1& 1/2 TABLETS BY MOUTH TWICE DAILY 180 tablet 4   loratadine (CLARITIN) 10 MG tablet TAKE 1 TABLET BY MOUTH EVERY DAY FOR ALLERGIES 90 tablet 0   No current facility-administered medications for this visit.    ROS: Review of Systems  Constitutional:  Negative for appetite change and unexpected weight change.  Neurological:  Negative for seizures.  Psychiatric/Behavioral:  Negative for decreased concentration, dysphoric mood, hallucinations, self-injury, sleep disturbance and suicidal ideas. The patient is not nervous/anxious.     Objective:  Psychiatric Specialty Exam: There were no vitals taken for this visit.There is no height or weight on file to calculate BMI.  General Appearance: Casual, Fairly Groomed,  and wearing glasses. Appears stated age  Eye Contact:  Minimal  Speech:  Clear and Coherent, Normal Rate, and overall one to two word answers with short sentence structure  Volume:  Normal  Mood:   "Better"  Affect:  Appropriate, Congruent, and Constricted but brighter than initial assessment  Thought Content: Logical and Hallucinations: None   Suicidal Thoughts:  No  Homicidal Thoughts:  No  Thought Process:  Goal Directed and concrete  Orientation:  Full (Time, Place, and Person)    Memory:  Immediate;   Poor  Judgment:  Other:  Limited at baseline  Insight:   Limited at baseline  Concentration:  Concentration: Poor  Recall:  Poor; limited at baseline  Fund of Knowledge: Fair  Language: Fair  Psychomotor Activity:  Normal  Akathisia:  No  AIMS (if indicated): not done  Assets:  Desire for Improvement Financial Resources/Insurance Housing Leisure Time Physical Health Resilience Social Support Transportation  ADL's:  Intact  Cognition: WNL  Sleep:  Fair    PE: General: sits comfortably in view of camera; no acute distress  Pulm: no increased work of breathing on room air MSK: all extremity movements appear intact  Neuro: no focal neurological deficits observed  Gait & Station: unable to assess by video    Metabolic Disorder Labs: Lab Results  Component Value Date   HGBA1C 5.4 10/09/2022   No results found for: "PROLACTIN" Lab Results  Component Value Date   CHOL 163 10/09/2022   TRIG 82 10/09/2022   HDL 41 10/09/2022   CHOLHDL 4.0 10/09/2022   LDLCALC 106 (H) 10/09/2022   LDLCALC 95 09/20/2021   Lab Results  Component Value Date   TSH 1.900 10/09/2022   TSH 2.320 09/20/2021    Therapeutic Level Labs: No results found for: "LITHIUM" No results found for: "VALPROATE" No results found for: "CBMZ"  Screenings:  GAD-7    Flowsheet Row Office Visit from 04/11/2023 in [facility] [facility] Office Visit from 10/09/2022 in [facility] Office Visit from 04/10/2022 in [facility] Counselor from 01/30/2022 in [facility] at [place]  Total GAD-7 Score 10 10 13 7       PHQ2-9    Flowsheet Row Office Visit from 04/11/2023 in [facility] Clinical Support from 03/10/2023 in [facility] Office Visit from 10/09/2022 in [facility] Office Visit from 04/10/2022 in [facility] Counselor from 01/30/2022 in [facility] at [facility] Total Score 2 2 4 5 6   PHQ-9 Total Score 10 10 15 20 18       Flowsheet Row Counselor from 01/30/2022 in [facility] at [place] Video Visit from 11/15/2021 in [facility] at [place] ED from 09/21/2020 in [facility] at [place]  C-SSRS RISK CATEGORY No Risk No Risk No Risk       Collaboration of Care: Collaboration of Care: Referral or follow-up with counselor/therapist AEB psychotherapy referral  Patient/Guardian was advised Release of Information must be obtained prior to any record release in order to collaborate their care with an outside provider. Patient/Guardian was advised if they have not already done so to contact the registration department to sign all necessary forms in order for [place] to release information regarding their care.   Consent: Patient/Guardian gives verbal consent for treatment and assignment of benefits for services provided during this visit. Patient/Guardian expressed understanding and agreed to proceed.   Televisit via video: I connected with Ichael on 04/28/23 at  3:30 PM EDT by a video enabled telemedicine application and verified that I am speaking with the correct person using two identifiers.  Location: Patient: home Provider: home office   I discussed the limitations of evaluation and management  by telemedicine and the availability of in person appointments. The patient expressed understanding and agreed to proceed.  I discussed the assessment and treatment plan with the patient. The patient was provided an opportunity  to ask questions and all were answered. The patient agreed with the plan and demonstrated an understanding of the instructions.   The patient was advised to call back or seek an in-person evaluation if the symptoms worsen or if the condition fails to improve as anticipated.  I provided 15 minutes of virtual face-to-face time during this encounter including gathering collateral from patient's adoptive mother.  Elsie Lincoln, MD 04/28/2023, 4:00 PM

## 2023-04-28 NOTE — Patient Instructions (Signed)
 We did not make any medication changes today.  You should be able to find vocational rehab services through [employer] and these should also be available through the autism [employer] of [place].  You will need to find a new provider that is in network and he may want to begin that search now.

## 2023-06-30 ENCOUNTER — Encounter (HOSPITAL_COMMUNITY): Payer: Self-pay | Admitting: Psychiatry

## 2023-06-30 ENCOUNTER — Telehealth (HOSPITAL_COMMUNITY): Payer: Medicare HMO | Admitting: Psychiatry

## 2023-06-30 DIAGNOSIS — F5104 Psychophysiologic insomnia: Secondary | ICD-10-CM | POA: Diagnosis not present

## 2023-06-30 DIAGNOSIS — F411 Generalized anxiety disorder: Secondary | ICD-10-CM

## 2023-06-30 DIAGNOSIS — F3340 Major depressive disorder, recurrent, in remission, unspecified: Secondary | ICD-10-CM | POA: Diagnosis not present

## 2023-06-30 DIAGNOSIS — F840 Autistic disorder: Secondary | ICD-10-CM | POA: Diagnosis not present

## 2023-06-30 MED ORDER — BUSPIRONE HCL 15 MG PO TABS: 15.0000 mg | ORAL_TABLET | Freq: Two times a day (BID) | ORAL | 5 refills | Status: DC

## 2023-06-30 NOTE — Patient Instructions (Signed)
 We increased the BuSpar  (buspirone ) to 50 mg twice daily today.  Please look into vocational rehab services in order to get connected with a job that suits your difficulties with anxiety and autism.  Similarly, please continue to try to establish with the autism Society of [place]  to get further resources.  To find a new psychiatrist local options would be [facility] in [place] or [facility] in [place].  You may be able to do telehealth with [facility] through their [place] or [place] office on [address].

## 2023-06-30 NOTE — Progress Notes (Signed)
 BH MD Outpatient Progress Note  06/30/2023 2:24 PM Joshua Sanchez  MRN:  [id number]  Assessment:  Joshua Sanchez presents for follow-up evaluation. Today, 06/30/23, patient reports overall stability of mood in the setting of maintenance of extremely sedentary lifestyle primarily watching YouTube and playing videogames.  Overall there have been little to no changes in time treating [name] and he continues to be resistant to behavioral recommendations which would be the foundational treatment modality for limitations from his autism.  We will titrate BuSpar  today however to see if this can help him further overcome anxiety and trying to interact with others.  His caretaker, Joshua Sanchez, agrees that he needs to engage with behavioral changes and continues to encourage him to contact autism Society and vocational rehab. Sleep cycle remains at baseline with lack of activity during the day. With his autism do not suspect there will be significant change in the rigidity of his thought over time. He continues in psychotherapy.  No follow-up planned due to provider leaving office.  Identifying Information: Joshua Sanchez is a 23 y.o. 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 Initial presentation on 11/15/21, see that note for full case formulation. Mood symptoms noted to be well controlled on combination of wellbutrin , buspar , and hydroxyzine  as needed at that time. He has had formal testing for autism and ADHD after he was adopted at the age of 66 and in interaction with Joshua Sanchez, concurred with these diagnosis. His adoptive mother, Joshua Sanchez, was present for much of the interview and able to fill in gaps around his history. His main psychiatric symptom was lower frustration tolerance around  social interactions and some rigidity of behaviors. Did discuss wellbutrin  use with neurology in between visits and they are ok with continuing wellbutrin  at current dosage with regard to seizure risk.    Plan:   # Autism  ADHD  psychophysiologic insomnia Past medication trials: ritalin, clonidine , wellbutrin , seroquel Status of problem: chronic and stable Interventions: -- continue clonidine  0.2mg  po qhs -- continue wellbutrin  XL 300mg  daily   # Major depressive disorder, in remission  Generalized anxiety disorder Past medication trials: prozac, sertraline, buspar , hydroxyzine , wellbutrin  Status of problem: Chronic and stable Interventions: -- Titrate buspar  to 15 mg po bid (i5/12/25) -- continue hydroxyzine  25mg  po bid PRN anxiety -- continue wellbutrin  as above -- psychotherapy    # 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 Ty Sanchez  Patient was given contact information for behavioral health clinic and was instructed to call 911 for emergencies.   Subjective:  Chief Complaint:  Chief Complaint  Patient presents with   Autism   Anxiety   Follow-up    Interval History: Link sent to [phone].   Things have been good since last appointment and thinks he is making progress. Not sure why he made appointment as he isn't feeling down anymore. Has been going outside more and has been going out with his mother and having good conversations. Still hasn't been looking into jobs citing wanting to focus on his health. Specifically can struggle being around people thinking they don't want him around. Doesn't do this often right now with last being a conversation with a gas station Financial controller. Hasn't  followed up with autism society resources or vocational rehab. Encouraged ongoing exposure therapy and was amenable to titration of  buspar . Sleep still variable with either sleeping well or not at all. Has bad days of going to bed at 5a and waking at 1-2p and a good day is typically 11p-8a. Has been cutting back on video games. Still eats once or twice per day which can be 1-2 sandwiches per day unless he is eating [employer] as before. Taking hydroyxzine has still been twice daily scheduled, encouraged PRN use. Still no seizures.  Joshua Sanchez has no concerns.   Visit Diagnosis:    ICD-10-CM   1. Autism  F84.0     2. Generalized anxiety disorder  F41.1 busPIRone  (BUSPAR ) 15 MG tablet    3. Recurrent major depressive disorder in remission (HCC)  F33.40     4. Psychophysiologic insomnia  F51.04        Past Psychiatric History:  Diagnoses: autism spectrum disorder, ADHD, generalized anxiety, major depression, generalized idopathic epilepsy Medication trials: yes but current medication is effective for calm. Without it difficult. Ritalin made angry and hyperactive and didn't eat. Prozac like zombie, sertraline sounds familiar.  Previous psychiatrist/therapist: yes since kindergarten and worked closely with [facility]. Hospitalizations: none Suicide attempts: none SIB: none Hx of violence towards others: in defense or when standing too close Current access to guns: none Hx of abuse: none that he is aware of Substance use: none  Past Medical History:  Past Medical History:  Diagnosis Date   ADHD (attention deficit hyperactivity disorder)    Anxiety and depression 11/13/2020   Autism    OCD (obsessive compulsive disorder)    Seizures (HCC)    focal   Separation anxiety     Past Surgical History:  Procedure Laterality Date   TONSILLECTOMY      Family Psychiatric History: unknown-adopted  Family History:  Family History  Adopted: Yes  Problem Relation Age of Onset   Cancer Neg Hx    Clotting disorder Neg Hx    Rheumatologic disease Neg Hx     Social History:  Social History   Socioeconomic History    Marital status: Single    Spouse name: Not on file   Number of children: Not on file   Years of education: Not on file   Highest education level: Not on file  Occupational History   Not on file  Tobacco Use   Smoking status: Never   Smokeless tobacco: Never  Vaping Use   Vaping status: Never Used  Substance and Sexual Activity   Alcohol use: Not Currently    Alcohol/week: 1.0 standard drink of alcohol    Types: 1 Standard drinks or equivalent per week    Comment: infrequent socially   Drug use: Never   Sexual activity: Never  Other Topics Concern   Not on file  Social History Narrative   Right handed      Highest level of edu- 12th grade      Lives with mom   One story home 5 steps into home   Caff. sodas   Social Drivers of Health   Financial Resource Strain: Medium Risk (03/10/2023)   Overall Financial Resource Strain (CARDIA)    Difficulty of Paying Living Expenses: Somewhat hard  Food Insecurity: No Food Insecurity (03/10/2023)   Hunger Vital Sign    Worried About Running Out of Food in the Last Year: Never true    Ran Out of Food in the Last Year:  Never true  Transportation Needs: No Transportation Needs (03/10/2023)   PRAPARE - Administrator, Civil Service (Medical): No    Lack of Transportation (Non-Medical): No  Physical Activity: Insufficiently Active (03/10/2023)   Exercise Vital Sign    Days of Exercise per Week: 1 day    Minutes of Exercise per Session: 20 min  Stress: No Stress Concern Present (03/10/2023)   [employer] of Occupational Health - Occupational Stress Questionnaire    Feeling of Stress : Not at all  Social Connections: Moderately Isolated (03/10/2023)   Social Connection and Isolation Panel [NHANES]    Frequency of Communication with Friends and Family: More than three times a week    Frequency of Social Gatherings with Friends and Family: More than three times a week    Attends Religious Services: More than 4 times per  year    Active Member of [employer] or Organizations: No    Attends Banker Meetings: Never    Marital Status: Never married    Allergies: No Known Allergies  Current Medications: Current Outpatient Medications  Medication Sig Dispense Refill   acetaminophen  (TYLENOL ) 500 MG tablet Take 1 tablet (500 mg total) by mouth every 6 (six) hours as needed. 30 tablet 0   buPROPion  (WELLBUTRIN  XL) 300 MG 24 hr tablet Take 1 tablet (300 mg total) by mouth daily. 30 tablet 5   busPIRone  (BUSPAR ) 15 MG tablet Take 1 tablet (15 mg total) by mouth 2 (two) times daily. 60 tablet 5   cloNIDine  (CATAPRES ) 0.2 MG tablet Take 1 tablet (0.2 mg total) by mouth at bedtime. 30 tablet 5   hydrOXYzine  (ATARAX ) 25 MG tablet Take 1 tablet (25 mg total) by mouth 2 (two) times daily as needed for anxiety. 60 tablet 5   levETIRAcetam  (KEPPRA ) 1000 MG tablet TAKE 1& 1/2 TABLETS BY MOUTH TWICE DAILY 180 tablet 4   loratadine  (CLARITIN ) 10 MG tablet TAKE 1 TABLET BY MOUTH EVERY DAY FOR ALLERGIES 90 tablet 0   No current facility-administered medications for this visit.    ROS: Review of Systems  Constitutional:  Negative for appetite change and unexpected weight change.  Neurological:  Negative for seizures.  Psychiatric/Behavioral:  Negative for decreased concentration, dysphoric mood, hallucinations, self-injury, sleep disturbance and suicidal ideas. The patient is nervous/anxious.     Objective:  Psychiatric Specialty Exam: There were no vitals taken for this visit.There is no height or weight on file to calculate BMI.  General Appearance: Casual, Fairly Groomed, and wearing glasses. Appears stated age  Eye Contact:  Minimal  Speech:  Clear and Coherent, Normal Rate, and overall one to two word answers with short sentence structure  Volume:  Normal  Mood:  "Good"  Affect:  Appropriate, Congruent, and Constricted but bright  Thought Content: Logical and Hallucinations: None   Suicidal Thoughts:  No   Homicidal Thoughts:  No  Thought Process:  Goal Directed and concrete  Orientation:  Full (Time, Place, and Person)    Memory:  Immediate;   Poor  Judgment:  Other:  Limited at baseline  Insight:  Limited at baseline  Concentration:  Concentration: Poor  Recall:  Poor; limited at baseline  Fund of Knowledge: Fair  Language: Fair  Psychomotor Activity:  Normal  Akathisia:  No  AIMS (if indicated): not done  Assets:  Desire for Improvement Financial Resources/Insurance Housing Leisure Time Physical Health Resilience Social Support Transportation  ADL's:  Intact  Cognition: WNL  Sleep:  Fair but variable  PE: General: sits comfortably in view of camera; no acute distress  Pulm: no increased work of breathing on room air MSK: all extremity movements appear intact  Neuro: no focal neurological deficits observed  Gait & Station: unable to assess by video    Metabolic Disorder Labs: Lab Results  Component Value Date   HGBA1C 5.4 10/09/2022   No results found for: "PROLACTIN" Lab Results  Component Value Date   CHOL 163 10/09/2022   TRIG 82 10/09/2022   HDL 41 10/09/2022   CHOLHDL 4.0 10/09/2022   LDLCALC 106 (H) 10/09/2022   LDLCALC 95 09/20/2021   Lab Results  Component Value Date   TSH 1.900 10/09/2022   TSH 2.320 09/20/2021    Therapeutic Level Labs: No results found for: "LITHIUM" No results found for: "VALPROATE" No results found for: "CBMZ"  Screenings:  GAD-7    Flowsheet Row Office Visit from 04/11/2023 in [facility] Office Visit from 10/09/2022 in [facility] Primary [facility] Office Visit from 04/10/2022 in [facility] Primary Care Counselor from 01/30/2022 in [facility] at [place]  Total GAD-7 Score 10 10 13 7       PHQ2-9    Flowsheet Row Office Visit from 04/11/2023 in [facility] Primary Care Clinical Support from 03/10/2023 in [facility] Primary  Care Office Visit from 10/09/2022 in [facility] Primary Care Office Visit from 04/10/2022 in [facility] Primary Care Counselor from 01/30/2022 in [facility] at [facility] Total Score 2 2 4 5 6   PHQ-9 Total Score 10 10 15 20 18       Flowsheet Row Counselor from 01/30/2022 in [facility] at [place] Video Visit from 11/15/2021 in [facility] at [place] [facility] from 09/21/2020 in [facility] Care at [facility]  C-SSRS RISK CATEGORY No Risk No Risk No Risk       Collaboration of Care: Collaboration of Care: Referral or follow-up with counselor/therapist AEB psychotherapy referral  Patient/Guardian was advised Release of Information must be obtained prior to any record release in order to collaborate their care with an outside provider. Patient/Guardian was advised if they have not already done so to contact the registration department to sign all necessary forms in order for us  to release information regarding their care.   Consent: Patient/Guardian gives verbal consent for treatment and assignment of benefits for services provided during this visit. Patient/Guardian expressed understanding and agreed to proceed.   Televisit via video: I connected with [place] on 06/30/23 at  2:00 PM EDT by a video enabled telemedicine application and verified that I am speaking with the correct person using two identifiers.  Location: Patient: home Provider: home office   I discussed the limitations of evaluation and management by telemedicine and the availability of in person appointments. The patient expressed understanding and agreed to proceed.  I discussed the assessment and treatment plan with the patient. The patient was provided an opportunity to ask questions and all were answered. The patient agreed with the plan and demonstrated an understanding of the instructions.   The patient  was advised to call back or seek an in-person evaluation if the symptoms worsen or if the condition fails to improve as anticipated.  I provided 20 minutes of virtual face-to-face time during this encounter including gathering collateral from patient's adoptive mother.  Madie Schilling, MD 06/30/2023, 2:24 PM

## 2023-08-04 ENCOUNTER — Other Ambulatory Visit: Payer: Self-pay | Admitting: Internal Medicine

## 2023-08-04 ENCOUNTER — Telehealth: Payer: Self-pay

## 2023-08-04 NOTE — Telephone Encounter (Signed)
 Copied from CRM [id number]. Topic: Appointments - Transfer of Care >> Aug 04, 2023 11:34 AM Alica Antu wrote: Pt is requesting to transfer FROM:  Dr. Kermit Ped  Pt is requesting to transfer TO: Dr. Gloria Zarwolo  Reason for requested transfer:  Dr. Kermit Ped leaving  It is the responsibility of the team the patient would like to transfer to (Dr. Gloria Zarwolo) to reach out to the patient if for any reason this transfer is not acceptable.

## 2023-09-18 ENCOUNTER — Encounter: Payer: Self-pay | Admitting: Neurology

## 2023-09-18 ENCOUNTER — Ambulatory Visit (INDEPENDENT_AMBULATORY_CARE_PROVIDER_SITE_OTHER): Payer: 59 | Admitting: Neurology

## 2023-09-18 VITALS — BP 114/79 | HR 78 | Ht 70.0 in | Wt 254.0 lb

## 2023-09-18 DIAGNOSIS — F419 Anxiety disorder, unspecified: Secondary | ICD-10-CM | POA: Diagnosis not present

## 2023-09-18 DIAGNOSIS — G40309 Generalized idiopathic epilepsy and epileptic syndromes, not intractable, without status epilepticus: Secondary | ICD-10-CM

## 2023-09-18 DIAGNOSIS — F32A Depression, unspecified: Secondary | ICD-10-CM

## 2023-09-18 MED ORDER — LEVETIRACETAM 1000 MG PO TABS: ORAL_TABLET | ORAL | 4 refills | Status: DC

## 2023-09-18 NOTE — Addendum Note (Signed)
 Addended by: COSETTE GENI PARAS on: 09/18/2023 10:51 AM   Modules accepted: Orders

## 2023-09-18 NOTE — Patient Instructions (Signed)
 Good to see you doing well. Continue Levetiracetam  1000mg : Take 1 and 1/2 tablets twice a day. Follow-up in 1 year, call for any changes.    Seizure Precautions: 1. If medication has been prescribed for you to prevent seizures, take it exactly as directed.  Do not stop taking the medicine without talking to your doctor first, even if you have not had a seizure in a long time.   2. Avoid activities in which a seizure would cause danger to yourself or to others.  Don't operate dangerous machinery, swim alone, or climb in high or dangerous places, such as on ladders, roofs, or girders.  Do not drive unless your doctor says you may.  3. If you have any warning that you may have a seizure, lay down in a safe place where you can't hurt yourself.    4.  No driving for 6 months from last seizure, as per [place]  state law.   Please refer to the following link on the Epilepsy Foundation of America's website for more information: http://www.epilepsyfoundation.org/answerplace/Social/driving/drivingu.cfm   5.  Maintain good sleep hygiene. Avoid alcohol.  6.  Contact your doctor if you have any problems that may be related to the medicine you are taking.  7.  Call 911 and bring the patient back to the ED if:        A.  The seizure lasts longer than 5 minutes.       B.  The patient doesn't awaken shortly after the seizure  C.  The patient has new problems such as difficulty seeing, speaking or moving  D.  The patient was injured during the seizure  E.  The patient has a temperature over 102 F (39C)  F.  The patient vomited and now is having trouble breathing

## 2023-09-18 NOTE — Progress Notes (Signed)
 NEUROLOGY FOLLOW UP OFFICE NOTE  Joshua Sanchez [id number] 2000/03/23  HISTORY OF PRESENT ILLNESS: I had the pleasure of seeing Joshua Sanchez in follow-up in the neurology clinic on 09/18/2023.  The patient was last seen 6 months ago for seizures. He is alone in the office today. Records and images were personally reviewed where available.  Since his last visit, he denies any seizures since 01/2022, triggered by poor sleep hygiene. He continues on Levetiracetam  1500mg  BID (1000mg  1.5 tabs BID) without side effects. He denies any staring/unresponsive episodes, olfactory/gustatory hallucinations, focal numbness/tingling/weakness, myoclonic jerks. He has headaches 1-2 times a week, no nausea/vomiting, Tylenol  helps. Headaches last 30 minutes to 2 hours. He denies any dizziness, no falls. He does not get enough sleep, but when he sleeps, he sleeps okay. Mood is okay, he still has very low lows and high highs. He reports his psychiatrist is moving, he is also interested in seeing a therapist. He lives with his mother who fixes his pillbox. He takes his own medications, his mother reminds him to take them. He does not drive and is unemployed.    History on Initial Assessment 12/01/2018: This is an 23 year old right-handed man with a history of autism spectrum disorder, ADHD, anxiety, presenting for evaluation of seizures. 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 He would drop things from his hand. One time he got dizzy with the shaking, no associated speech changes or confusion. He was having them at school where teachers would call his mother and tell him he did not seem right and was unable to focus after he alerted them about the hand shaking. Shaking would last 6-7 minutes. His mother reports having an EEG in [facility] where they did not find anything and he was not started on medication. He was having these episodes  once a month, no clear triggers. On 11/01/2018, he recalls waking up feeling fine and taking a shower, then waking up in the ambulance. He had bitten his lip and felt diffusely weak with a headache. His mother had heard him fall and came to the bathroom to find him with body jerking, head banging on the floor. She thinks he had urinary incontinence. She tried to get him up and he smacked her, confused. She feels his left side was weaker. He was brought to [facility] where CBC, CMP, UDS were normal. I personally reviewed head CT without contrast which was unremarkable. He was discharged home on Levetiracetam  500mg  BID. No further convulsions since then, but he had one episode of hand shaking yesterday where he felt funny/uneasy. He feels tired on the Levetiracetam . He reports headaches for the past 4 years occurring every other day, mostly on the left side with pressure and throbbing. Pain can last all day sometimes, with sensitivity to sound. No nausea/vomiting. He usually lays down if headaches are mild, and would take an Ibuprofen  sometimes. He denies any dizziness, diplopia, dysarthria/dysphagia, neck pain, bowel/bladder dysfunction. His mother denies any staring/unresponsive episodes. He denies any gaps in time, olfactory/gustatory hallucinations. Over the past 2 years, he has had sleep difficulties, awake at night and asleep during the day. He may take melatonin or a sleep aid to help sleep at night. His mother administers medications. He does not drive.  He was adopted at age 23, diagnosed with autism spectrum disorder, ADHD, anxiety at age 23 or 82. As far as his mother knows, he had a  normal birth and early development, no febrile convulsions, CNS infections, family history of seizures.   Diagnostic Data: MRI brain with and without contrast done 12/2018 which did not show any acute changes, hippocampi symmetric with no abnormal signal or enhancement seen.  1-hour wake and sleep EEG in 02/2019 was  normal   PAST MEDICAL HISTORY: Past Medical History:  Diagnosis Date   ADHD (attention deficit hyperactivity disorder)    Anxiety and depression 11/13/2020   Autism    OCD (obsessive compulsive disorder)    Seizures (HCC)    focal   Separation anxiety     MEDICATIONS: Current Outpatient Medications on File Prior to Visit  Medication Sig Dispense Refill   acetaminophen  (TYLENOL ) 500 MG tablet Take 1 tablet (500 mg total) by mouth every 6 (six) hours as needed. 30 tablet 0   buPROPion  (WELLBUTRIN  XL) 300 MG 24 hr tablet Take 1 tablet (300 mg total) by mouth daily. 30 tablet 5   busPIRone  (BUSPAR ) 15 MG tablet Take 1 tablet (15 mg total) by mouth 2 (two) times daily. 60 tablet 5   cloNIDine  (CATAPRES ) 0.2 MG tablet Take 1 tablet (0.2 mg total) by mouth at bedtime. 30 tablet 5   hydrOXYzine  (ATARAX ) 25 MG tablet Take 1 tablet (25 mg total) by mouth 2 (two) times daily as needed for anxiety. 60 tablet 5   levETIRAcetam  (KEPPRA ) 1000 MG tablet TAKE 1& 1/2 TABLETS BY MOUTH TWICE DAILY 180 tablet 4   lisinopril (ZESTRIL) 10 MG tablet Take 10 mg by mouth daily.     loratadine  (CLARITIN ) 10 MG tablet TAKE 1 TABLET BY MOUTH EVERY DAY FOR ALLERGIES 90 tablet 0   No current facility-administered medications on file prior to visit.    ALLERGIES: No Known Allergies  FAMILY HISTORY: Family History  Adopted: Yes  Problem Relation Age of Onset   Cancer Neg Hx    Clotting disorder Neg Hx    Rheumatologic disease Neg Hx     SOCIAL HISTORY: Social History   Socioeconomic History   Marital status: Single    Spouse name: Not on file   Number of children: Not on file   Years of education: Not on file   Highest education level: Not on file  Occupational History   Not on file  Tobacco Use   Smoking status: Never   Smokeless tobacco: Never  Vaping Use   Vaping status: Never Used  Substance and Sexual Activity   Alcohol use: Not Currently    Alcohol/week: 1.0 standard drink of alcohol     Types: 1 Standard drinks or equivalent per week    Comment: infrequent socially   Drug use: Never   Sexual activity: Never  Other Topics Concern   Not on file  Social History Narrative   Right handed      Highest level of edu- 12th grade      Lives with mom   One story home 5 steps into home   Caff. sodas   Social Drivers of Health   Financial Resource Strain: Medium Risk (03/10/2023)   Overall Financial Resource Strain (CARDIA)    Difficulty of Paying Living Expenses: Somewhat hard  Food Insecurity: No Food Insecurity (03/10/2023)   Hunger Vital Sign    Worried About Running Out of Food in the Last Year: Never true    Ran Out of Food in the Last Year: Never true  Transportation Needs: No Transportation Needs (03/10/2023)   PRAPARE - Transportation    Lack of  Transportation (Medical): No    Lack of Transportation (Non-Medical): No  Physical Activity: Insufficiently Active (03/10/2023)   Exercise Vital Sign    Days of Exercise per Week: 1 day    Minutes of Exercise per Session: 20 min  Stress: No Stress Concern Present (03/10/2023)   [employer] of Occupational Health - Occupational Stress Questionnaire    Feeling of Stress : Not at all  Social Connections: Moderately Isolated (03/10/2023)   Social Connection and Isolation Panel    Frequency of Communication with Friends and Family: More than three times a week    Frequency of Social Gatherings with Friends and Family: More than three times a week    Attends Religious Services: More than 4 times per year    Active Member of [employer] or Organizations: No    Attends Banker Meetings: Never    Marital Status: Never married  Intimate Partner Violence: Not At Risk (03/10/2023)   Humiliation, Afraid, Rape, and Kick questionnaire    Fear of Current or Ex-Partner: No    Emotionally Abused: No    Physically Abused: No    Sexually Abused: No     PHYSICAL EXAM: Vitals:   09/18/23 0942  BP: 114/79  Pulse: 78   SpO2: 97%   General: No acute distress Head:  Normocephalic/atraumatic Skin/Extremities: No rash, no edema Neurological Exam: alert and awake. No aphasia or dysarthria. Fund of knowledge is appropriate. Attention and concentration are normal.   Cranial nerves: Pupils equal, round. Extraocular movements intact with no nystagmus. Visual fields full.  No facial asymmetry.  Motor: Bulk and tone normal, muscle strength 5/5 throughout with no pronator drift.   Finger to nose testing intact.  Gait narrow-based and steady, able to tandem walk adequately.  Romberg negative.   IMPRESSION: This is a 24 yo RH man with a history of autism spectrum disorder, ADHD, anxiety, with generalized convulsions, seizure-free for over 2 years until he had 2 seizures in April 2023 in the setting of missing medication and sleep deprivation, then another in December 2023 possibly due to poor sleep hygiene. No seizures since 01/2022, continue Levetiracetam  1500mg  BID. Continue follow-up with Psychiatry, he is interested in seeing a therapist for anxiety/depression. He does not drive. Follow-up in 1 year, call for any changes.   Thank you for allowing me to participate in his care.  Please do not hesitate to call for any questions or concerns.    Darice Shivers, M.D.   CC: Dr. Antonetta

## 2023-10-09 ENCOUNTER — Encounter: Payer: 59 | Admitting: Internal Medicine

## 2023-10-23 ENCOUNTER — Ambulatory Visit (INDEPENDENT_AMBULATORY_CARE_PROVIDER_SITE_OTHER): Payer: Self-pay | Admitting: Family Medicine

## 2023-10-23 VITALS — BP 119/81 | HR 85 | Wt 258.1 lb

## 2023-10-23 DIAGNOSIS — E7849 Other hyperlipidemia: Secondary | ICD-10-CM

## 2023-10-23 DIAGNOSIS — R7301 Impaired fasting glucose: Secondary | ICD-10-CM | POA: Diagnosis not present

## 2023-10-23 DIAGNOSIS — Z23 Encounter for immunization: Secondary | ICD-10-CM

## 2023-10-23 DIAGNOSIS — E038 Other specified hypothyroidism: Secondary | ICD-10-CM

## 2023-10-23 DIAGNOSIS — E559 Vitamin D deficiency, unspecified: Secondary | ICD-10-CM | POA: Diagnosis not present

## 2023-10-23 DIAGNOSIS — I10 Essential (primary) hypertension: Secondary | ICD-10-CM

## 2023-10-23 DIAGNOSIS — F411 Generalized anxiety disorder: Secondary | ICD-10-CM | POA: Diagnosis not present

## 2023-10-23 DIAGNOSIS — T7840XD Allergy, unspecified, subsequent encounter: Secondary | ICD-10-CM

## 2023-10-23 DIAGNOSIS — J302 Other seasonal allergic rhinitis: Secondary | ICD-10-CM | POA: Diagnosis not present

## 2023-10-23 MED ORDER — LORATADINE 10 MG PO TABS: ORAL_TABLET | ORAL | 1 refills | Status: AC

## 2023-10-23 MED ORDER — LISINOPRIL 10 MG PO TABS: 10.0000 mg | ORAL_TABLET | Freq: Every day | ORAL | 1 refills | Status: AC

## 2023-10-23 NOTE — Patient Instructions (Signed)
 I appreciate the opportunity to provide care to you today!    Follow up:  5 months  Labs: please stop by the lab during the week to get your blood drawn (CBC, CMP, TSH, Lipid profile, HgA1c, Vit D)  For a Healthier YOU, I Recommend: Reducing your intake of sugar, sodium, carbohydrates, and saturated fats. Increasing your fiber intake by incorporating more whole grains, fruits, and vegetables into your meals. Setting healthy goals with a focus on lowering your consumption of carbs, sugar, and unhealthy fats. Adding variety to your diet by including a wide range of fruits and vegetables. Cutting back on soda and limiting processed foods as much as possible. Staying active: In addition to taking your weight loss medication, aim for at least 150 minutes of moderate-intensity physical activity each week for optimal results.    Please follow up if your symptoms worsen or fail to improve.   Please continue to a heart-healthy diet and increase your physical activities. Try to exercise for at least five days a week.    It was a pleasure to see you and I look forward to continuing to work together on your health and well-being. Please do not hesitate to call the office if you need care or have questions about your care.  In case of emergency, please visit the Emergency Department for urgent care, or contact our clinic at [phone] to schedule an appointment. We're here to help you!   Have a wonderful day and week. With Gratitude, Litsy Epting MSN, FNP-BC

## 2023-10-23 NOTE — Assessment & Plan Note (Signed)
 Encouraged to continue current treatment regimen as is  Encouraged to continue care with psychiatry

## 2023-10-23 NOTE — Assessment & Plan Note (Signed)
Controlled with lisinopril 10 mg daily.

## 2023-10-23 NOTE — Progress Notes (Signed)
 Acute Office Visit  Subjective:    Patient ID: Denzell Colasanti, male    DOB: 2000-10-22, 23 y.o.   MRN: [id number]  Chief Complaint  Patient presents with   Depression   Anxiety    HPI The patient is here today for an anxiety and depression follow-up with his mother. He reports that the medication has been working well when he remembers to take it, and that his symptoms are not as severe as they used to be. He reports that there are days when he wakes up and forgets to take his medications. He denies suicidal ideation (SI), homicidal ideation (HI), and auditory or visual hallucinations (AVH).  Past Medical History:  Diagnosis Date   ADHD (attention deficit hyperactivity disorder)    Anxiety and depression 11/13/2020   Autism    OCD (obsessive compulsive disorder)    Seizures (HCC)    focal   Separation anxiety     Past Surgical History:  Procedure Laterality Date   TONSILLECTOMY      Family History  Adopted: Yes  Problem Relation Age of Onset   Cancer Neg Hx    Clotting disorder Neg Hx    Rheumatologic disease Neg Hx     Social History   Socioeconomic History   Marital status: Single    Spouse name: Not on file   Number of children: Not on file   Years of education: Not on file   Highest education level: Not on file  Occupational History   Not on file  Tobacco Use   Smoking status: Never   Smokeless tobacco: Never  Vaping Use   Vaping status: Never Used  Substance and Sexual Activity   Alcohol use: Not Currently    Alcohol/week: 1.0 standard drink of alcohol    Types: 1 Standard drinks or equivalent per week    Comment: infrequent socially   Drug use: Never   Sexual activity: Never  Other Topics Concern   Not on file  Social History Narrative   Right handed      Highest level of edu- 12th grade      Lives with mom   One story home 5 steps into home   Caff. sodas   Social Drivers of Health   Financial Resource Strain: Medium Risk (03/10/2023)    Overall Financial Resource Strain (CARDIA)    Difficulty of Paying Living Expenses: Somewhat hard  Food Insecurity: No Food Insecurity (03/10/2023)   Hunger Vital Sign    Worried About Running Out of Food in the Last Year: Never true    Ran Out of Food in the Last Year: Never true  Transportation Needs: No Transportation Needs (03/10/2023)   PRAPARE - Administrator, Civil Service (Medical): No    Lack of Transportation (Non-Medical): No  Physical Activity: Insufficiently Active (03/10/2023)   Exercise Vital Sign    Days of Exercise per Week: 1 day    Minutes of Exercise per Session: 20 min  Stress: No Stress Concern Present (03/10/2023)   [employer] of Occupational Health - Occupational Stress Questionnaire    Feeling of Stress : Not at all  Social Connections: Moderately Isolated (03/10/2023)   Social Connection and Isolation Panel    Frequency of Communication with Friends and Family: More than three times a week    Frequency of Social Gatherings with Friends and Family: More than three times a week    Attends Religious Services: More than 4 times per year  Active Member of Clubs or Organizations: No    Attends Banker Meetings: Never    Marital Status: Never married  Intimate Partner Violence: Not At Risk (03/10/2023)   Humiliation, Afraid, Rape, and Kick questionnaire    Fear of Current or Ex-Partner: No    Emotionally Abused: No    Physically Abused: No    Sexually Abused: No    Outpatient Medications Prior to Visit  Medication Sig Dispense Refill   acetaminophen  (TYLENOL ) 500 MG tablet Take 1 tablet (500 mg total) by mouth every 6 (six) hours as needed. 30 tablet 0   buPROPion  (WELLBUTRIN  XL) 300 MG 24 hr tablet Take 1 tablet (300 mg total) by mouth daily. 30 tablet 5   busPIRone  (BUSPAR ) 15 MG tablet Take 1 tablet (15 mg total) by mouth 2 (two) times daily. 60 tablet 5   cloNIDine  (CATAPRES ) 0.2 MG tablet Take 1 tablet (0.2 mg total) by mouth  at bedtime. 30 tablet 5   hydrOXYzine  (ATARAX ) 25 MG tablet Take 1 tablet (25 mg total) by mouth 2 (two) times daily as needed for anxiety. 60 tablet 5   levETIRAcetam  (KEPPRA ) 1000 MG tablet TAKE 1& 1/2 TABLETS BY MOUTH TWICE DAILY 180 tablet 4   lisinopril  (ZESTRIL ) 10 MG tablet Take 10 mg by mouth daily.     loratadine  (CLARITIN ) 10 MG tablet TAKE 1 TABLET BY MOUTH EVERY DAY FOR ALLERGIES 90 tablet 0   No facility-administered medications prior to visit.    No Known Allergies  Review of Systems  Constitutional:  Negative for fatigue and fever.  Eyes:  Negative for visual disturbance.  Respiratory:  Negative for chest tightness and shortness of breath.   Cardiovascular:  Negative for chest pain and palpitations.  Neurological:  Negative for dizziness and headaches.       Objective:    Physical Exam HENT:     Head: Normocephalic.     Right Ear: External ear normal.     Left Ear: External ear normal.     Nose: No congestion or rhinorrhea.     Mouth/Throat:     Mouth: Mucous membranes are moist.  Cardiovascular:     Rate and Rhythm: Regular rhythm.     Heart sounds: No murmur heard. Pulmonary:     Effort: No respiratory distress.     Breath sounds: Normal breath sounds.  Neurological:     Mental Status: He is alert.     BP 119/81 (BP Location: Left Arm, Patient Position: Sitting, Cuff Size: Large)   Pulse 85   Wt 258 lb 1.9 oz (117.1 kg)   SpO2 96%   BMI 37.04 kg/m  Wt Readings from Last 3 Encounters:  10/23/23 258 lb 1.9 oz (117.1 kg)  09/18/23 254 lb (115.2 kg)  04/11/23 257 lb (116.6 kg)       Assessment & Plan:  Primary hypertension Assessment & Plan: Controlled with lisinopril  10 mg daily  Orders: -     Lisinopril ; Take 1 tablet (10 mg total) by mouth daily.  Dispense: 90 tablet; Refill: 1  Generalized anxiety disorder Assessment & Plan: Encouraged to continue current treatment regimen as is  Encouraged to continue care with  psychiatry   Encounter for immunization -     Flu vaccine trivalent PF, 6mos and older(Flulaval,Afluria,Fluarix,Fluzone)  Allergy, subsequent encounter -     Loratadine ; TAKE 1 TABLET BY MOUTH EVERY DAY FOR ALLERGIES  Dispense: 90 tablet; Refill: 1  IFG (impaired fasting glucose) -  Hemoglobin A1c  Vitamin D  deficiency -     VITAMIN D  25 Hydroxy (Vit-D Deficiency, Fractures)  TSH (thyroid -stimulating hormone deficiency) -     TSH + free T4  Other hyperlipidemia -     Lipid panel -     CMP14+EGFR -     CBC with Differential/Platelet  Note: This chart has been completed using Engineer, civil (consulting) software, and while attempts have been made to ensure accuracy, certain words and phrases may not be transcribed as intended.    Bryson Palen, FNP

## 2023-12-24 ENCOUNTER — Encounter (HOSPITAL_COMMUNITY): Payer: Self-pay

## 2023-12-24 ENCOUNTER — Ambulatory Visit (INDEPENDENT_AMBULATORY_CARE_PROVIDER_SITE_OTHER): Admitting: Clinical

## 2023-12-24 DIAGNOSIS — F331 Major depressive disorder, recurrent, moderate: Secondary | ICD-10-CM

## 2023-12-24 DIAGNOSIS — F419 Anxiety disorder, unspecified: Secondary | ICD-10-CM

## 2023-12-24 NOTE — Progress Notes (Signed)
 IN PERSON   I connected with Joshua Sanchez on 12/24/23 at  3:00 PM EST in person and verified that I am speaking with the correct person using two identifiers.  Location: Patient: office Provider: office    I discussed the limitations of evaluation and management by telemedicine and the availability of in person appointments. The patient expressed understanding and agreed to proceed.   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 No data recorded Referral name: No data recorded Referral phone number: No data recorded  Whom do you see for routine medical problems? No data recorded Practice/Facility Name: No data recorded Practice/Facility Phone Number: No data recorded Name of Contact: No data recorded Contact Number: No data recorded Contact Fax Number: No data recorded Prescriber Name: No data recorded Prescriber Address (if known): No data recorded  What Is the Reason for Your Visit/Call Today? No data recorded How Long Has This Been Causing You Problems? No data recorded What Do You Feel Would Help You the Most Today? No data recorded  Have You Recently Been in Any Inpatient Treatment (Hospital/Detox/Crisis Center/28-Day Program)? No data recorded Name/Location of Program/Hospital:No data recorded How Long Were You There? No data recorded When Were You Discharged? No data recorded  Have You Ever Received Services From [facility] Before? No data recorded Who Do You See at [facility]? No data recorded  Have You Recently Had Any Thoughts About Hurting Yourself? No data recorded Are You Planning to Commit Suicide/Harm Yourself At This time? No data recorded  Have you Recently Had Thoughts About Hurting Someone Sherral? No data  recorded Explanation: No data recorded  Have You Used Any Alcohol or Drugs in the Past 24 Hours? No data recorded How Long Ago Did You Use Drugs or Alcohol? No data recorded What Did You Use and How Much? No data recorded  Do You Currently Have a Therapist/Psychiatrist? No data recorded Name of Therapist/Psychiatrist: No data recorded  Have You Been Recently Discharged From Any Office Practice or Programs? No data recorded Explanation of Discharge From Practice/Program: No data recorded    CCA Screening Triage Referral Assessment Type of Contact: No data recorded Is this Initial or Reassessment? No data recorded Date Telepsych consult ordered in [facility]:  No data recorded Time Telepsych consult ordered in [facility]:  No data recorded  Patient Reported Information Reviewed? No data recorded Patient Left Without Being Seen? No data recorded Reason for Not Completing Assessment: No data recorded  Collateral Involvement: No data recorded  Does Patient Have a Court Appointed Legal Guardian? No data recorded Name and Contact of Legal Guardian: No data recorded If Minor and Not Living with Parent(s), Who has Custody? No data recorded Is CPS involved or ever been involved? No data recorded Is APS involved or ever been involved? No data recorded  Patient Determined To Be At Risk for Harm To Self or Others Based on Review of Patient Reported Information or Presenting Complaint? No data recorded Method: No data recorded Availability of Means: No data recorded Intent: No data recorded Notification Required: No data recorded Additional Information for Danger to Others Potential: No data recorded Additional Comments for Danger to Others Potential: No data recorded Are There Guns or Other Weapons in Your Home? No data recorded Types of Guns/Weapons: No data  recorded Are These Weapons Safely Secured?                            No data recorded Who Could Verify You Are Able To Have These Secured: No  data recorded Do You Have any Outstanding Charges, Pending Court Dates, Parole/Probation? No data recorded Contacted To Inform of Risk of Harm To Self or Others: No data recorded  Location of Assessment: No data recorded  Does Patient Present under Involuntary Commitment? No data recorded IVC Papers Initial File Date: No data recorded  [place] of Residence: No data recorded  Patient Currently Receiving the Following Services: No data recorded  Determination of Need: No data recorded  Options For Referral: No data recorded    CCA Biopsychosocial Intake/Chief Complaint:  The patient was referred by his PCP Gloria Zarolow  for further evaluation for MH treatment services . The patient identifies difficulty with Anxiety and Depression and was previously seeing Dr. Barbra for psychiatry currently the patient notes his PCP is managing those medications post Dr. Barbra leaving the [facility] health practice.  Current Symptoms/Problems: The patient verbalizes difficulty with mood management, anxiety, and sleep cycle   Patient Reported Schizophrenia/Schizoaffective Diagnosis in Past: No   Strengths: The patient notes he is a good listener cares for others.  Preferences: Watching Tv or playing on computer and walking dog/ pet caregiving  Abilities: Playing on Computer, Working on [employer]  working with hands   Type of Services Patient Feels are Needed: The patient is currently receiving med therapy through PCP / Individual Therapy.   Initial Clinical Notes/Concerns: The patient has had prior counseling with [facility]. (2023) The patient is was previously seeing Dr. Barbra for Med Management. The patient did a prior assessment and was indicated for OPT around 22yr ago with [facility]. No prior hospitalizations for MH.   Mental Health Symptoms Depression:  Change in energy/activity; Difficulty Concentrating; Fatigue; Hopelessness; Increase/decrease in appetite;  Irritability; Sleep (too much or little); Tearfulness; Weight gain/loss; Worthlessness   Duration of Depressive symptoms: Greater than two weeks   Mania:  None   Anxiety:   Worrying; Tension; Sleep; Irritability; Fatigue; Difficulty concentrating; Restlessness   Psychosis:  None   Duration of Psychotic symptoms: NA  Trauma:  None   Obsessions:  None   Compulsions:  None   Inattention:  None   Hyperactivity/Impulsivity:  None   Oppositional/Defiant Behaviors:  None   Emotional Irregularity:  None   Other Mood/Personality Symptoms:  NA    Mental Status Exam Appearance and self-care  Stature:  Average   Weight:  Overweight   Clothing:  Casual   Grooming:  Normal   Cosmetic use:  None   Posture/gait:  Normal   Motor activity:  Not Remarkable   Sensorium  Attention:  Distractible   Concentration:  Anxiety interferes   Orientation:  X5   Recall/memory:  Defective in Short-term   Affect and Mood  Affect:  Appropriate   Mood:  Anxious; Depressed   Relating  Eye contact:  Normal   Facial expression:  Depressed; Responsive   Attitude toward examiner:  Cooperative   Thought and Language  Speech flow: Normal   Thought content:  Appropriate to Mood and Circumstances   Preoccupation:  None   Hallucinations:  None   Organization:  Logical   Company Secretary of Knowledge:  Good   Intelligence:  Average   Abstraction:  Normal  Judgement:  Good   Reality Testing:  Realistic   Insight:  Good   Decision Making:  Normal   Social Functioning  Social Maturity:  Isolates   Social Judgement:  Normal   Stress  Stressors:  Surveyor, Quantity (Uncle passed within past year.)   Coping Ability:  Normal   Skill Deficits:  None   Supports:  Family; Friends/Service system     Religion: Religion/Spirituality Are You A Religious Person?: No How Might This Affect Treatment?: NA  Leisure/Recreation: Leisure / Recreation Do You Have  Hobbies?: Yes Leisure and Hobbies: enjoys vreating and building with hands , enjoys computers  Exercise/Diet: Exercise/Diet Do You Exercise?: Yes What Type of Exercise Do You Do?: Run/Walk How Many Times a Week Do You Exercise?: 1-3 times a week Have You Gained or Lost A Significant Amount of Weight in the Past Six Months?: No Do You Follow a Special Diet?: No Do You Have Any Trouble Sleeping?: Yes Explanation of Sleeping Difficulties: The patient has difficulty with irratic sleep pattern getting too much sleep and periods of not getting enough sleep.   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?: No  Education: Education Is Patient Currently Attending School?: No Last Grade Completed: 12 Name of High School: [employer] Did Garment/textile Technologist From [employer]?: Yes Did You Attend College?: No Did You Attend Graduate School?: No Did You Have An Individualized Education Program (IIEP): No Did You Have Any Difficulty At School?: No Patient's Education Has Been Impacted by Current Illness: No   CCA Family/Childhood History Family and Relationship History: Family history Marital status: Single Are you sexually active?: No What is your sexual orientation?: Heterosexual Has your sexual activity been affected by drugs, alcohol, medication, or emotional stress?: NA Does patient have children?: No  Childhood History:  Childhood History By whom was/is the patient raised?: Foster parents, Adoptive parents Additional childhood history information: The patient notes he was raised until age 37 by a sister and then put into the foster care system Description of patient's relationship  with caregiver when they were a child: The patient notes he was with a family from age [age] and then knows the person who adopted the patient. Patient's description of current relationship with people who raised him/her: The patient notes having a great relationship currently with his adoptive Mother How were you disciplined when you got in trouble as a child/adolescent?: Grounding. Does patient have siblings?: Yes Number of Siblings: 2 Description of patient's current relationship with siblings: The patient has 2 sisters. The patient notes not having contact with either sister since he was a child. Did patient suffer any verbal/emotional/physical/sexual abuse as a child?: No Did patient suffer from severe childhood neglect?: No Has patient ever been sexually abused/assaulted/raped as an adolescent or adult?: No Was the patient ever a victim of a crime or a disaster?: No Witnessed domestic violence?: No Has patient been affected by domestic violence as an adult?: No  Child/Adolescent Assessment:     CCA Substance Use Alcohol/Drug Use: Alcohol / Drug Use Pain Medications: See MAR Prescriptions: See MAR Over the Counter: Tylonol History of alcohol / drug use?: No history of alcohol / drug abuse  ASAM's:  Six Dimensions of Multidimensional Assessment  Dimension 1:  Acute Intoxication and/or Withdrawal Potential:      Dimension 2:  Biomedical Conditions and Complications:      Dimension 3:  Emotional, Behavioral, or Cognitive Conditions and Complications:     Dimension 4:  Readiness to Change:     Dimension 5:  Relapse, Continued use, or Continued Problem Potential:     Dimension 6:  Recovery/Living Environment:     ASAM Severity Score:    ASAM Recommended Level of Treatment:     Substance use Disorder (SUD)    Recommendations for Services/Supports/Treatments: Recommendations for Services/Supports/Treatments Recommendations For  Services/Supports/Treatments: Individual Therapy, Medication Management  DSM5 Diagnoses: Patient Active Problem List   Diagnosis Date Noted   Psychophysiologic insomnia 06/30/2023   Vitamin D  deficiency 04/11/2023   Encounter for well adult exam with abnormal findings 10/09/2022   Need for Tdap vaccination 10/09/2022   Need for influenza vaccination 04/10/2022   Generalized anxiety disorder 11/15/2021   Recurrent major depressive disorder in remission 11/15/2021   Annual physical exam 09/19/2021   Microcytosis 09/19/2021   Hypertension 06/06/2021   Headache 11/21/2020   Seizures (HCC) 11/13/2020   Back pain 11/13/2020   Autism 11/13/2020   ADHD 11/13/2020    Patient Centered Plan: Patient is on the following Treatment Plan(s):  Recurrent Moderate MDD with Anxiety    Referrals to Alternative Service(s): Referred to Alternative Service(s):   Place:   Date:   Time:    Referred to Alternative Service(s):   Place:   Date:   Time:    Referred to Alternative Service(s):   Place:   Date:   Time:    Referred to Alternative Service(s):   Place:   Date:   Time:      Collaboration of Care: No Additional collaboration for this session.  Patient/Guardian was advised Release of Information must be obtained prior to any record release in order to collaborate their care with an outside provider. Patient/Guardian was advised if they have not already done so to contact the registration department to sign all necessary forms in order for us  to release information regarding their care.   Consent: Patient/Guardian gives verbal consent for treatment and assignment of benefits for services provided during this visit. Patient/Guardian expressed understanding and agreed to proceed.   I discussed the assessment and treatment plan with the patient. The patient was provided an opportunity to ask questions and all were answered. The patient agreed with the plan and demonstrated an understanding of the  instructions.   The patient was advised to call back or seek an in-person evaluation if the symptoms worsen or if the condition fails to improve as anticipated.  I provided 45 minutes of face-to-face time during this encounter.   Jerel ONEIDA Pepper, LCSW  12/24/2023

## 2024-01-07 ENCOUNTER — Encounter: Payer: Self-pay | Admitting: Neurology

## 2024-01-28 ENCOUNTER — Telehealth: Payer: Self-pay | Admitting: Family Medicine

## 2024-01-28 ENCOUNTER — Ambulatory Visit (INDEPENDENT_AMBULATORY_CARE_PROVIDER_SITE_OTHER): Admitting: Clinical

## 2024-01-28 DIAGNOSIS — F331 Major depressive disorder, recurrent, moderate: Secondary | ICD-10-CM | POA: Diagnosis not present

## 2024-01-28 DIAGNOSIS — F419 Anxiety disorder, unspecified: Secondary | ICD-10-CM

## 2024-01-28 DIAGNOSIS — F339 Major depressive disorder, recurrent, unspecified: Secondary | ICD-10-CM | POA: Diagnosis not present

## 2024-01-28 NOTE — Progress Notes (Signed)
 IN PERSON   I connected with Joshua Sanchez on 01/28/24 at  2:00 PM EST in person and verified that I am speaking with the correct person using two identifiers.  Location: Patient: office Provider: office    I discussed the limitations of evaluation and management by telemedicine and the availability of in person appointments. The patient expressed understanding and agreed to proceed.     THERAPIST PROGRESS NOTE   Session Time: 2:00 PM- 2:30 PM   Participation Level: Active   Behavioral Response: CasualAlert/Anxious   Type of Therapy: Individual Therapy   Treatment Goals addressed: Coping for MH diagnoses    Interventions: CBT, Motivational Interviewing, Strength-based and Supportive   Summary: Joshua Sanchez 23yr old male with MDD with Anxiety.The OPT therapist worked with the patient for his ongoing OPT treatment. The OPT therapist utilized Motivational Interviewing to assist in creating therapeutic repore. The OPT therapist gained feedback about the patients triggers and symptoms over the past few weeks. The patient spoke about needing to get more medication from his PCP and will be calling to address running out of his anxiety medication.. The OPT therapist overivewed the patients interactions in the home with family over the course of the recent Thanksgiving holiday and plans for upcoming Christmas holiday. The OPT therapist worked with the patient on coping strategies for management of his MH symptoms for the Winter season.The patient spoke about plans for the upcoming Christmas holiday. The patient spoke about his goals for the new year including getting his license, getting a job, and doing more exercise to help lose weight. The OPT therapist worked with the patient promoting consistency with his med therapy and working with the patient reviewing his basic needs areas.The OPT therapist overviewed the patients upcoming appointments as listed in MyChart .   Suicidal/Homicidal:  Nowithout intent/plan   Therapist Response: The OPT therapist worked with the patient for the patients scheduled session. The patient was engaged in his session and gave feedback in relation to triggers, symptoms, and behavior responses over the past few weeks The patient spoke. about needing to get more medication from his PCP and will be calling to address running out of his anxiety medication.. The OPT therapist overivewed the patients interactions in the home with family over the course of the recent Thanksgiving holiday and spoke about plans for Christmas.The patient spoke The OPT therapist worked utilizing an in Psychologist, Forensic Therapy exercise. The OPT therapist assessed the patients behaviors and interactions in the home as well as socialization outside the family. The OPT therapist worked with the patient who identified individual goals for the 2026 year including getting his license, getting a job, and doing more exercise to help lose weight The OPT therapist worked in session on challenging negative automatic thoughts exercise. The OPT therapist overviewed with the patient his basic needs areas. The patient will continue to work on created blue print plan through the end of the year and into January. The OPT therapist will continue treatment work with the patient in his next session.    Plan: Follow up in 2/3 weeks   Diagnosis:      Axis I:  Recurrent MDD with Anxiety                         Axis II: No diagnosis   Collaboration of Care: No Additional collaboration for this session.    Patient/Guardian was advised Release of Information must be obtained prior to any record  release in order to collaborate their care with an outside provider. Patient/Guardian was advised if they have not already done so to contact the registration department to sign all necessary forms in order for us  to release information regarding their care.    Consent: Patient/Guardian gives verbal consent for  treatment and assignment of benefits for services provided during this visit. Patient/Guardian expressed understanding and agreed to proceed.    I discussed the assessment and treatment plan with the patient. The patient was provided an opportunity to ask questions and all were answered. The patient agreed with the plan and demonstrated an understanding of the instructions.   The patient was advised to call back or seek an in-person evaluation if the symptoms worsen or if the condition fails to improve as anticipated.   I provided 30 minutes of face-to-face time during this encounter.     Jerel Pepper, LCSW   01/28/2024

## 2024-01-28 NOTE — Telephone Encounter (Signed)
 Patient mother came by the office patient needs refills on these medications and may need prior authorization  hydrOXYzine  (ATARAX ) 25 MG tablet [[id number]]   busPIRone  (BUSPAR ) 15 MG tablet [[id number]]   Pharmacy: [facility]

## 2024-01-29 ENCOUNTER — Telehealth: Payer: Self-pay | Admitting: Family Medicine

## 2024-01-29 NOTE — Telephone Encounter (Signed)
 Medication: Dr.Stinson prescribed this medication and patients mother wants to know if his PCP will prescribe it.   hydrOXYzine  (ATARAX ) 25 MG tablet      busPIRone  (BUSPAR ) 15 MG tablet   Unable to pend meds.

## 2024-01-29 NOTE — Telephone Encounter (Unsigned)
 Copied from CRM #[id number]. Topic: Clinical - Medication Refill >> Jan 29, 2024  4:22 PM Joesph B wrote: Medication: Dr.Stinson prescribed this medication and patients mother wants to know if his PCP will prescribe it.  hydrOXYzine  (ATARAX ) 25 MG tablet    busPIRone  (BUSPAR ) 15 MG tablet    Has the patient contacted their pharmacy? Yes (Agent: If no, request that the patient contact the pharmacy for the refill. If patient does not wish to contact the pharmacy document the reason why and proceed with request.) (Agent: If yes, when and what did the pharmacy advise?)  This is the patient's preferred pharmacy:  [id number] - [place] - [address]  [place] [address] Phone: [phone] Fax: [phone]  Is this the correct pharmacy for this prescription? Yes If no, delete pharmacy and type the correct one.   Has the prescription been filled recently? Yes  Is the patient out of the medication? Yes  Has the patient been seen for an appointment in the last year OR does the patient have an upcoming appointment? Yes  Can we respond through MyChart? Yes  Agent: Please be advised that Rx refills may take up to 3 business days. We ask that you follow-up with your pharmacy.

## 2024-01-29 NOTE — Telephone Encounter (Signed)
 Patient mother informed.

## 2024-02-02 ENCOUNTER — Telehealth: Payer: Self-pay

## 2024-02-02 NOTE — Telephone Encounter (Signed)
 Copied from CRM [id number]. Topic: Clinical - Prescription Issue >> Feb 02, 2024 10:21 AM Nathanel BROCKS wrote: Reason for CRM:  Pt is a pt of Dr Consuello. He is out of 2 rx's that  Dr Jayson Peel wrote. 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 She stated that she came into the office on Thursday of last week and spoke to Dr Consuello front desk to see if they can get this refilled. He has been out for 3 weeks. She was follow up on this, this morning but I didn't see any notes and I called the office and they advised to send a crm. He is needing his  busPIRone  (BUSPAR ) 15 MG tablet hydrOXYzine  (ATARAX ) 25 MG tablet  Please advise pts mom what to do inregards to this matter.   ----------------------------------------------------------------------- From previous Reason for Contact - Medication Refill: Medication:   Has the patient contacted their pharmacy?   (Agent: If no, request that the patient contact the pharmacy for the refill. If patient does not wish to contact the pharmacy document the reason why and proceed with request.) (Agent: If yes, when and what did the pharmacy advise?)  This is the patient's preferred pharmacy:  [id number] - [place], [place] - [address] [place] [address] Phone: [phone] Fax: [phone]  Is this the correct pharmacy for this prescription?   If no, delete pharmacy and type the correct one.   Has the prescription been filled recently?    Is the patient out of the medication?    Has the patient been seen for an appointment in the last year OR does the patient have an upcoming appointment?    Can we respond through MyChart?    Agent: Please be advised that Rx refills may take up to 3 business days. We ask that you follow-up with your pharmacy.

## 2024-02-06 ENCOUNTER — Other Ambulatory Visit: Payer: Self-pay | Admitting: Family Medicine

## 2024-02-06 DIAGNOSIS — F411 Generalized anxiety disorder: Secondary | ICD-10-CM

## 2024-02-06 MED ORDER — BUSPIRONE HCL 15 MG PO TABS: 15.0000 mg | ORAL_TABLET | Freq: Two times a day (BID) | ORAL | 5 refills | Status: AC

## 2024-02-06 MED ORDER — HYDROXYZINE HCL 25 MG PO TABS: 25.0000 mg | ORAL_TABLET | Freq: Two times a day (BID) | ORAL | 5 refills | Status: DC | PRN

## 2024-02-21 ENCOUNTER — Emergency Department (HOSPITAL_COMMUNITY)

## 2024-02-21 ENCOUNTER — Emergency Department (HOSPITAL_COMMUNITY)
Admission: EM | Admit: 2024-02-21 | Discharge: 2024-02-21 | Disposition: A | Discharge status: Home / Self Care | Source: Home / Self Care | Attending: Emergency Medicine | Admitting: Emergency Medicine

## 2024-02-21 ENCOUNTER — Other Ambulatory Visit: Payer: Self-pay

## 2024-02-21 ENCOUNTER — Encounter (HOSPITAL_COMMUNITY): Payer: Self-pay | Admitting: *Deleted

## 2024-02-21 DIAGNOSIS — I10 Essential (primary) hypertension: Secondary | ICD-10-CM | POA: Diagnosis not present

## 2024-02-21 DIAGNOSIS — R040 Epistaxis: Secondary | ICD-10-CM | POA: Insufficient documentation

## 2024-02-21 DIAGNOSIS — R519 Headache, unspecified: Secondary | ICD-10-CM | POA: Diagnosis not present

## 2024-02-21 HISTORY — DX: Essential (primary) hypertension: I10

## 2024-02-21 HISTORY — DX: Depression, unspecified: F32.A

## 2024-02-21 MED ORDER — KETOROLAC TROMETHAMINE 15 MG/ML IJ SOLN
15.0000 mg | Freq: Once | INTRAMUSCULAR | Status: AC
  Administered 2024-02-21: 15 mg via INTRAMUSCULAR
  Filled 2024-02-21: qty 1

## 2024-02-21 NOTE — Discharge Instructions (Signed)
 May continue to take Tylenol  or ibuprofen  every 6 hours as needed for any headaches.  Please follow-up with your neurologist for further evaluation and management of continued headaches.  Please follow-up with ENT for any continued nosebleeds as needed.  May use over-the-counter saline nasal spray for the next few days to help keep nose moist has seasonal changes can a lot of times because these nosebleeds.  Return to ED if any symptoms worsen including severe uncontrollable headache, uncontrollable nausea/vomiting, syncopal episode, uncontrollable nosebleed.

## 2024-02-21 NOTE — ED Triage Notes (Signed)
 Pt with c/o HA x 2 weeks-1st week was off and on and 2nd week HA has been constant. Pt with nosebleeds off and on 3 weeks.

## 2024-02-21 NOTE — ED Provider Notes (Signed)
 " [place] EMERGENCY DEPARTMENT AT [facility] Provider Note   CSN: [id number] Arrival date & time: 02/21/24  1627     Patient presents with: Headache and Epistaxis   Joshua Sanchez is a 24 y.o. male.  Patient is a 24 year old male with a history of seizures, headaches, hypertension who presents to the ED with caregiver for increasing headaches and nosebleeds for the past 2 to 3 weeks.  Patient notes he has had approximately 3-4 nosebleeds over the past 2 weeks with the most recent this morning.  Notes that the nosebleed lasted approximately 5 minutes.  He also notes that he has had increased frequency and headaches.  He states he will have a headache at the same time as a nosebleed but always has a mild underlying headache.  Notes he has had headaches previously but they never last this long.  He has tried to take Tylenol  which helps with the pain some but then returns.  He notes he has had nosebleeds as well that have had to be cauterized previously but has not had them in a while.  He states he has been taking his other medications as prescribed and has not had any breakthrough seizure activity in over a year.  Denies dizziness, vision changes, syncope, chest pain, shortness of breath.  No further complaints.     Headache Associated symptoms: no dizziness and no fever   Epistaxis Associated symptoms: headaches   Associated symptoms: no dizziness and no fever        Prior to Admission medications  Medication Sig Start Date End Date Taking? Authorizing Provider  acetaminophen  (TYLENOL ) 500 MG tablet Take 1 tablet (500 mg total) by mouth every 6 (six) hours as needed. 11/21/20   Elnor Fairy HERO, NP  buPROPion  (WELLBUTRIN  XL) 300 MG 24 hr tablet Take 1 tablet (300 mg total) by mouth daily. 04/28/23   Barbra Jayson LABOR, MD  busPIRone  (BUSPAR ) 15 MG tablet Take 1 tablet (15 mg total) by mouth 2 (two) times daily. 02/06/24   Bacchus, Meade PEDLAR, FNP  cloNIDine  (CATAPRES ) 0.2 MG tablet  Take 1 tablet (0.2 mg total) by mouth at bedtime. 04/28/23 10/25/23  Barbra Jayson LABOR, MD  hydrOXYzine  (ATARAX ) 25 MG tablet Take 1 tablet (25 mg total) by mouth 2 (two) times daily as needed for anxiety. 02/06/24   Edman Meade PEDLAR, FNP  levETIRAcetam  (KEPPRA ) 1000 MG tablet TAKE 1& 1/2 TABLETS BY MOUTH TWICE DAILY 09/18/23   Georjean Darice HERO, MD  lisinopril  (ZESTRIL ) 10 MG tablet Take 1 tablet (10 mg total) by mouth daily. 10/23/23   Bacchus, Meade PEDLAR, FNP  loratadine  (CLARITIN ) 10 MG tablet TAKE 1 TABLET BY MOUTH EVERY DAY FOR ALLERGIES 10/23/23   Bacchus, Gloria Z, FNP    Allergies: Patient has no known allergies.    Review of Systems  Constitutional:  Negative for fever.  HENT:  Positive for nosebleeds.   Eyes:  Negative for visual disturbance.  Respiratory:  Negative for shortness of breath.   Cardiovascular:  Negative for chest pain.  Neurological:  Positive for headaches. Negative for dizziness and syncope.  All other systems reviewed and are negative.   Updated Vital Signs BP 135/79 (BP Location: Right Arm)   Pulse 90   Temp 98.8 F (37.1 C) (Oral)   Resp 16   Ht 5' 10 (1.778 m)   Wt 119.3 kg   SpO2 96%   BMI 37.74 kg/m   Physical Exam Constitutional:      Appearance:  He is well-developed.  HENT:     Head: Normocephalic and atraumatic.  Eyes:     Extraocular Movements: Extraocular movements intact.     Pupils: Pupils are equal, round, and reactive to light.  Cardiovascular:     Rate and Rhythm: Normal rate.  Pulmonary:     Effort: Pulmonary effort is normal.  Skin:    General: Skin is warm and dry.  Neurological:     Mental Status: He is alert and oriented to person, place, and time.     Cranial Nerves: No cranial nerve deficit.  Psychiatric:        Mood and Affect: Mood normal.        Behavior: Behavior normal.     (all labs ordered are listed, but only abnormal results are displayed) Labs Reviewed - No data to display  EKG: None  Radiology: CT Head Wo  Contrast Result Date: 02/21/2024 CLINICAL DATA:  Initial evaluation for acute headache, increasing in frequency or severity. EXAM: CT HEAD WITHOUT CONTRAST TECHNIQUE: Contiguous axial images were obtained from the base of the skull through the vertex without intravenous contrast. RADIATION DOSE REDUCTION: This exam was performed according to the departmental dose-optimization program which includes automated exposure control, adjustment of the mA and/or kV according to patient size and/or use of iterative reconstruction technique. COMPARISON:  Prior MRI from 02/21/2018. FINDINGS: Brain: Cerebral volume within normal limits for patient age. No acute intracranial hemorrhage. No acute large vessel territory infarct. No mass lesion, midline shift, or mass effect. Ventricles are normal in size without hydrocephalus. No extra-axial fluid collection. Vascular: No abnormal hyperdense vessel. Skull: Scalp soft tissues demonstrate no acute abnormality. Calvarium intact. Sinuses/Orbits: Globes and orbital soft tissues within normal limits. Visualized paranasal sinuses are largely clear. No significant mastoid effusion. IMPRESSION: Normal head CT. No acute intracranial abnormality. Electronically Signed   By: Morene Hoard M.D.   On: 02/21/2024 19:05      Medications Ordered in the ED  ketorolac  (TORADOL ) 15 MG/ML injection 15 mg (15 mg Intramuscular Given 02/21/24 1836)                                    Medical Decision Making Patient is a 24 year old male with a history of seizure disorder, headaches, and hypertension who presents to the ED for increasing headaches and nosebleeds for the past 2 to 3 weeks.  Please see detailed HPI above.  On exam patient is alert and well-appearing.  Physical exam as noted above.  No acute neurologic deficits noted.  Differential includes seasonal changes, tension headaches, migraine headaches, intracranial hypertension, intracranial mass/abnormality, acute bleed.  CT of  the head without contrast was obtained due to seizure history.  There is no acute abnormality noted on CT today.  He was given Toradol  in ED for pain with improvement.  No acute nosebleeds noted while in ED.  No concerns for further emergent workup at this time as vital signs are stable and no other symptoms reported.  Stable for discharge home today.  Advised to follow-up with neurologist for further evaluation and management of continued headaches.  Resources for ENT provided for follow-up for any continued nosebleeds as needed.  Advised to use a saline nasal spray for the next few days as well.  Return precautions provided for worsening symptoms.  Amount and/or Complexity of Data Reviewed Radiology: ordered.  Risk Prescription drug management.  Final diagnoses:  Epistaxis  Nonintractable headache, unspecified chronicity pattern, unspecified headache type    ED Discharge Orders     None          Neysa Thersia RAMAN, [place] 02/21/24 1937  "

## 2024-02-23 ENCOUNTER — Other Ambulatory Visit: Payer: Self-pay

## 2024-02-23 ENCOUNTER — Telehealth: Payer: Self-pay | Admitting: Family Medicine

## 2024-02-23 DIAGNOSIS — F411 Generalized anxiety disorder: Secondary | ICD-10-CM

## 2024-02-23 DIAGNOSIS — F908 Attention-deficit hyperactivity disorder, other type: Secondary | ICD-10-CM

## 2024-02-23 DIAGNOSIS — F330 Major depressive disorder, recurrent, mild: Secondary | ICD-10-CM

## 2024-02-23 MED ORDER — BUPROPION HCL ER (XL) 300 MG PO TB24: 300.0000 mg | ORAL_TABLET | Freq: Every day | ORAL | 5 refills | Status: AC

## 2024-02-23 NOTE — Telephone Encounter (Signed)
"  Sent to pharmacy  "

## 2024-02-23 NOTE — Telephone Encounter (Signed)
 Prescription Request  02/23/2024  LOV: 10/23/2023  What is the name of the medication or equipment? buPROPion  (WELLBUTRIN  XL) 300 MG 24 hr tablet [   Have you contacted your pharmacy to request a refill? Yes   Which pharmacy would you like this sent to?  [id number] - [place], [place] - [address] [place] [address] Phone: [phone] Fax: [phone]    Patient notified that their request is being sent to the clinical staff for review and that they should receive a response within 2 business days.   Please advise at walked into office

## 2024-02-25 ENCOUNTER — Other Ambulatory Visit: Payer: Self-pay

## 2024-02-25 MED ORDER — LEVETIRACETAM 1000 MG PO TABS: ORAL_TABLET | ORAL | 2 refills | Status: DC

## 2024-02-26 ENCOUNTER — Other Ambulatory Visit: Payer: Self-pay | Admitting: Family Medicine

## 2024-02-26 DIAGNOSIS — F411 Generalized anxiety disorder: Secondary | ICD-10-CM

## 2024-02-26 MED ORDER — HYDROXYZINE HCL 25 MG PO TABS: 25.0000 mg | ORAL_TABLET | Freq: Two times a day (BID) | ORAL | 5 refills | Status: AC | PRN

## 2024-02-26 MED ORDER — LEVETIRACETAM 1000 MG PO TABS: ORAL_TABLET | ORAL | 2 refills | Status: AC

## 2024-02-26 NOTE — Addendum Note (Signed)
 Addended by: TAFT MOATS on: 02/26/2024 04:02 PM   Modules accepted: Orders

## 2024-03-10 ENCOUNTER — Ambulatory Visit (HOSPITAL_COMMUNITY): Admitting: Clinical

## 2024-03-10 DIAGNOSIS — F331 Major depressive disorder, recurrent, moderate: Secondary | ICD-10-CM

## 2024-03-10 DIAGNOSIS — F419 Anxiety disorder, unspecified: Secondary | ICD-10-CM

## 2024-03-10 DIAGNOSIS — F339 Major depressive disorder, recurrent, unspecified: Secondary | ICD-10-CM | POA: Diagnosis not present

## 2024-03-10 NOTE — Progress Notes (Signed)
 IN PERSON    I connected with Joshua Sanchez on 03/10/24 at  3:00 PM EST in person and verified that I am speaking with the correct person using two identifiers.   Location: Patient: office Provider: office    I discussed the limitations of evaluation and management by telemedicine and the availability of in person appointments. The patient expressed understanding and agreed to proceed.         THERAPIST PROGRESS NOTE   Session Time: 3:00 PM- 3:25 PM   Participation Level: Active   Behavioral Response: CasualAlert/Anxious   Type of Therapy: Individual Therapy   Treatment Goals addressed: Coping for MH diagnoses    Interventions: CBT, Motivational Interviewing, Strength-based and Supportive   Summary: Joshua Sanchez 24yr old male with MDD with Anxiety.The OPT therapist worked with the patient for his ongoing OPT treatment. The OPT therapist utilized Motivational Interviewing to assist in creating therapeutic repore. The OPT therapist gained feedback about the patients triggers and symptoms over the past few weeks through the holidays and into the new year.  The patient spoke about transitioning med therapy and will be starting with psychiatrist Shavon Rankin.. The OPT therapist overivewed the patients interactions in the home with family over the course of the recent holidays and the patient spoke about implementing coping to manage the family gatherings with a trigger for the anxiety being in a group of people/crowd. The OPT therapist worked with the patient on coping strategies for management of his MH symptoms for the Winter season.The patient spoke his ongoing focus on goals of license, getting a job, and doing more exercise to help lose weight potentially joining a local gym and going when there is less of a crowd. The OPT therapist worked with the patient promoting consistency with his med therapy and working with the patient reviewing his basic needs areas.The OPT therapist overviewed  the patients upcoming appointments as listed in MyChart .   Suicidal/Homicidal: Nowithout intent/plan   Therapist Response: The OPT therapist worked with the patient for the patients scheduled session. The patient was engaged in his session and gave feedback in relation to triggers, symptoms, and behavior responses over the past few weeks through the holidays and into the new year. The OPT therapist overivewed the patients interactions in the home with family over the course of the recent holidays with family gatherings  and the patient spoke about utilizing coping and positive thinking to work through the events including his Mothers recent birthday celebrated at a [employer]. The OPT therapist worked utilizing an in Psychologist, Forensic Therapy exercise. The OPT therapist assessed the patients behaviors and interactions in the home as well as socialization outside the family. The OPT therapist worked with the patient who identified individual goals for the 2026 year including getting his license, getting a job, and doing more exercise to help lose weight The OPT therapist worked in session on challenging negative automatic thoughts exercise. The OPT therapist overviewed with the patient his basic needs areas. The patient will continue to work on created blue print plan through the end of January to get closer to meeting his goals. Overall the patient noted a reduced amount of depression and anxiety episodes with improvement in implementation of coping skills. The OPT therapist will continue treatment work with the patient in his next session.    Plan: Follow up in 2/3 weeks   Diagnosis:      Axis I:  Recurrent MDD with Anxiety  Axis II: No diagnosis   Collaboration of Care: No Additional collaboration for this session.    Patient/Guardian was advised Release of Information must be obtained prior to any record release in order to collaborate their care with an  outside provider. Patient/Guardian was advised if they have not already done so to contact the registration department to sign all necessary forms in order for us  to release information regarding their care.    Consent: Patient/Guardian gives verbal consent for treatment and assignment of benefits for services provided during this visit. Patient/Guardian expressed understanding and agreed to proceed.    I discussed the assessment and treatment plan with the patient. The patient was provided an opportunity to ask questions and all were answered. The patient agreed with the plan and demonstrated an understanding of the instructions.   The patient was advised to call back or seek an in-person evaluation if the symptoms worsen or if the condition fails to improve as anticipated.   I provided 25 minutes of face-to-face time during this encounter.     Jerel Pepper, LCSW   03/10/2024

## 2024-03-15 ENCOUNTER — Ambulatory Visit: Payer: 59

## 2024-03-26 ENCOUNTER — Ambulatory Visit: Admitting: Family Medicine

## 2024-04-01 ENCOUNTER — Ambulatory Visit: Admitting: Family Medicine

## 2024-04-01 ENCOUNTER — Ambulatory Visit (HOSPITAL_COMMUNITY): Admitting: Clinical

## 2024-04-19 ENCOUNTER — Ambulatory Visit

## 2024-06-21 ENCOUNTER — Ambulatory Visit (HOSPITAL_COMMUNITY): Admitting: Registered Nurse

## 2024-09-17 ENCOUNTER — Ambulatory Visit: Admitting: Neurology
